# Patient Record
Sex: Female | Born: 1955 | Race: White | Hispanic: Yes | Marital: Single | Smoking: Former smoker
Health system: Southern US, Community
[De-identification: ages and names within clinical notes are randomized; demographics above are authoritative.]

## PROBLEM LIST (undated history)

## (undated) DIAGNOSIS — D649 Anemia, unspecified: Secondary | ICD-10-CM

## (undated) DIAGNOSIS — M1990 Unspecified osteoarthritis, unspecified site: Secondary | ICD-10-CM

## (undated) DIAGNOSIS — A630 Anogenital (venereal) warts: Secondary | ICD-10-CM

## (undated) DIAGNOSIS — IMO0002 Reserved for concepts with insufficient information to code with codable children: Secondary | ICD-10-CM

## (undated) DIAGNOSIS — I10 Essential (primary) hypertension: Secondary | ICD-10-CM

## (undated) DIAGNOSIS — Z860100 Personal history of colon polyps, unspecified: Secondary | ICD-10-CM

## (undated) DIAGNOSIS — N871 Moderate cervical dysplasia: Secondary | ICD-10-CM

## (undated) HISTORY — DX: Anogenital (venereal) warts: A63.0

## (undated) HISTORY — DX: Moderate cervical dysplasia: N87.1

## (undated) HISTORY — DX: Reserved for concepts with insufficient information to code with codable children: IMO0002

## (undated) HISTORY — DX: Essential (primary) hypertension: I10

## (undated) HISTORY — DX: Anemia, unspecified: D64.9

## (undated) HISTORY — DX: Personal history of colonic polyps: Z86.010

## (undated) HISTORY — DX: Personal history of colon polyps, unspecified: Z86.0100

## (undated) HISTORY — PX: OTHER SURGICAL HISTORY: SHX169

## (undated) HISTORY — PX: DILATION AND CURETTAGE OF UTERUS: SHX78

## (undated) HISTORY — PX: LIPOMA EXCISION: SHX5283

---

## 1999-08-02 ENCOUNTER — Other Ambulatory Visit: Admission: RE | Admit: 1999-08-02 | Discharge: 1999-08-02 | Payer: Self-pay | Admitting: Gynecology

## 1999-09-19 ENCOUNTER — Encounter (INDEPENDENT_AMBULATORY_CARE_PROVIDER_SITE_OTHER): Payer: Self-pay

## 1999-09-19 ENCOUNTER — Other Ambulatory Visit: Admission: RE | Admit: 1999-09-19 | Discharge: 1999-09-19 | Payer: Self-pay | Admitting: Gynecology

## 1999-09-23 HISTORY — PX: CERVICAL BIOPSY  W/ LOOP ELECTRODE EXCISION: SUR135

## 1999-10-08 ENCOUNTER — Encounter (INDEPENDENT_AMBULATORY_CARE_PROVIDER_SITE_OTHER): Payer: Self-pay | Admitting: Specialist

## 1999-10-08 ENCOUNTER — Other Ambulatory Visit: Admission: RE | Admit: 1999-10-08 | Discharge: 1999-10-08 | Payer: Self-pay | Admitting: Gynecology

## 2000-02-12 ENCOUNTER — Other Ambulatory Visit: Admission: RE | Admit: 2000-02-12 | Discharge: 2000-02-12 | Payer: Self-pay | Admitting: Gynecology

## 2000-03-13 ENCOUNTER — Other Ambulatory Visit: Admission: RE | Admit: 2000-03-13 | Discharge: 2000-03-13 | Payer: Self-pay | Admitting: Gynecology

## 2000-11-11 ENCOUNTER — Other Ambulatory Visit: Admission: RE | Admit: 2000-11-11 | Discharge: 2000-11-11 | Payer: Self-pay | Admitting: Gynecology

## 2000-12-11 ENCOUNTER — Ambulatory Visit (HOSPITAL_COMMUNITY): Admission: RE | Admit: 2000-12-11 | Discharge: 2000-12-11 | Payer: Self-pay | Admitting: Gastroenterology

## 2002-01-29 ENCOUNTER — Emergency Department (HOSPITAL_COMMUNITY): Admission: EM | Admit: 2002-01-29 | Discharge: 2002-01-29 | Payer: Self-pay

## 2002-06-20 ENCOUNTER — Other Ambulatory Visit: Admission: RE | Admit: 2002-06-20 | Discharge: 2002-06-20 | Payer: Self-pay | Admitting: Gynecology

## 2003-09-27 ENCOUNTER — Other Ambulatory Visit: Admission: RE | Admit: 2003-09-27 | Discharge: 2003-09-27 | Payer: Self-pay | Admitting: Gynecology

## 2004-06-05 ENCOUNTER — Other Ambulatory Visit: Admission: RE | Admit: 2004-06-05 | Discharge: 2004-06-05 | Payer: Self-pay | Admitting: Gynecology

## 2005-02-28 ENCOUNTER — Emergency Department (HOSPITAL_COMMUNITY): Admission: EM | Admit: 2005-02-28 | Discharge: 2005-02-28 | Payer: Self-pay | Admitting: Emergency Medicine

## 2005-03-26 ENCOUNTER — Other Ambulatory Visit: Admission: RE | Admit: 2005-03-26 | Discharge: 2005-03-26 | Payer: Self-pay | Admitting: Gynecology

## 2006-04-14 ENCOUNTER — Other Ambulatory Visit: Admission: RE | Admit: 2006-04-14 | Discharge: 2006-04-14 | Payer: Self-pay | Admitting: Gynecology

## 2007-05-27 ENCOUNTER — Other Ambulatory Visit: Admission: RE | Admit: 2007-05-27 | Discharge: 2007-05-27 | Payer: Self-pay | Admitting: Gynecology

## 2008-05-19 ENCOUNTER — Encounter: Payer: Self-pay | Admitting: Gynecology

## 2008-05-19 ENCOUNTER — Ambulatory Visit (HOSPITAL_BASED_OUTPATIENT_CLINIC_OR_DEPARTMENT_OTHER): Admission: RE | Admit: 2008-05-19 | Discharge: 2008-05-19 | Payer: Self-pay | Admitting: Gynecology

## 2009-03-05 ENCOUNTER — Other Ambulatory Visit: Admission: RE | Admit: 2009-03-05 | Discharge: 2009-03-05 | Payer: Self-pay | Admitting: Gynecology

## 2009-03-05 ENCOUNTER — Ambulatory Visit: Payer: Self-pay | Admitting: Gynecology

## 2009-03-05 ENCOUNTER — Encounter: Payer: Self-pay | Admitting: Gynecology

## 2009-03-20 ENCOUNTER — Ambulatory Visit: Payer: Self-pay | Admitting: Gynecology

## 2009-08-01 ENCOUNTER — Emergency Department (HOSPITAL_BASED_OUTPATIENT_CLINIC_OR_DEPARTMENT_OTHER): Admission: EM | Admit: 2009-08-01 | Discharge: 2009-08-01 | Payer: Self-pay | Admitting: Emergency Medicine

## 2009-08-01 ENCOUNTER — Ambulatory Visit: Payer: Self-pay | Admitting: Diagnostic Radiology

## 2010-02-07 ENCOUNTER — Ambulatory Visit (HOSPITAL_BASED_OUTPATIENT_CLINIC_OR_DEPARTMENT_OTHER): Admission: RE | Admit: 2010-02-07 | Discharge: 2010-02-07 | Payer: Self-pay | Admitting: General Surgery

## 2010-06-13 ENCOUNTER — Emergency Department (HOSPITAL_COMMUNITY): Admission: EM | Admit: 2010-06-13 | Discharge: 2010-06-13 | Payer: Self-pay | Admitting: Emergency Medicine

## 2010-06-20 ENCOUNTER — Other Ambulatory Visit: Admission: RE | Admit: 2010-06-20 | Discharge: 2010-06-20 | Payer: Self-pay | Admitting: Gynecology

## 2010-06-20 ENCOUNTER — Ambulatory Visit: Payer: Self-pay | Admitting: Gynecology

## 2010-06-24 ENCOUNTER — Ambulatory Visit: Payer: Self-pay | Admitting: Gynecology

## 2010-07-01 ENCOUNTER — Ambulatory Visit: Payer: Self-pay | Admitting: Gynecology

## 2010-07-22 ENCOUNTER — Ambulatory Visit: Payer: Self-pay | Admitting: Gynecology

## 2010-08-08 ENCOUNTER — Ambulatory Visit: Payer: Self-pay | Admitting: Diagnostic Radiology

## 2010-08-08 ENCOUNTER — Emergency Department (HOSPITAL_BASED_OUTPATIENT_CLINIC_OR_DEPARTMENT_OTHER): Admission: EM | Admit: 2010-08-08 | Discharge: 2010-08-08 | Payer: Self-pay | Admitting: Emergency Medicine

## 2010-12-05 LAB — CBC
MCH: 29.6 pg (ref 26.0–34.0)
MCHC: 34.1 g/dL (ref 30.0–36.0)
MCV: 86.8 fL (ref 78.0–100.0)
Platelets: 233 10*3/uL (ref 150–400)
RDW: 13.7 % (ref 11.5–15.5)

## 2010-12-05 LAB — POCT CARDIAC MARKERS
Myoglobin, poc: 52 ng/mL (ref 12–200)
Troponin i, poc: <0.05 ng/mL (ref 0.00–0.09)

## 2010-12-05 LAB — DIFFERENTIAL
Basophils Relative: 1 % (ref 0–1)
Eosinophils Absolute: 0.2 10*3/uL (ref 0.0–0.7)
Eosinophils Relative: 2 % (ref 0–5)

## 2010-12-05 LAB — BASIC METABOLIC PANEL
BUN: 9 mg/dL (ref 6–23)
CO2: 25 mEq/L (ref 19–32)
Chloride: 108 mEq/L (ref 96–112)
Creatinine, Ser: 0.66 mg/dL (ref 0.4–1.2)

## 2010-12-05 LAB — TSH: TSH: 2.002 u[IU]/mL (ref 0.350–4.500)

## 2010-12-09 LAB — CBC
MCHC: 34.5 g/dL (ref 30.0–36.0)
MCV: 87.8 fL (ref 78.0–100.0)
Platelets: 196 10*3/uL (ref 150–400)
RBC: 4.01 MIL/uL (ref 3.87–5.11)
WBC: 7 10*3/uL (ref 4.0–10.5)

## 2010-12-09 LAB — DIFFERENTIAL
Basophils Relative: 1 % (ref 0–1)
Eosinophils Absolute: 0.3 10*3/uL (ref 0.0–0.7)
Lymphs Abs: 2.2 10*3/uL (ref 0.7–4.0)
Neutro Abs: 4.1 10*3/uL (ref 1.7–7.7)
Neutrophils Relative %: 58 % (ref 43–77)

## 2010-12-09 LAB — BASIC METABOLIC PANEL
BUN: 10 mg/dL (ref 6–23)
Calcium: 8.5 mg/dL (ref 8.4–10.5)
Chloride: 106 mEq/L (ref 96–112)
Creatinine, Ser: 0.53 mg/dL (ref 0.4–1.2)
GFR calc Af Amer: >60 mL/min (ref >60)

## 2010-12-25 LAB — BASIC METABOLIC PANEL
Calcium: 9.1 mg/dL (ref 8.4–10.5)
GFR calc Af Amer: >60 mL/min (ref >60)
GFR calc non Af Amer: >60 mL/min (ref >60)
Potassium: 3.9 mEq/L (ref 3.5–5.1)
Sodium: 141 mEq/L (ref 135–145)

## 2010-12-25 LAB — POCT CARDIAC MARKERS
CKMB, poc: 1.7 ng/mL (ref 1.0–8.0)
Myoglobin, poc: 64.8 ng/mL (ref 12–200)
Troponin i, poc: <0.05 ng/mL (ref 0.00–0.09)

## 2010-12-25 LAB — CBC
HCT: 38 % (ref 36.0–46.0)
Hemoglobin: 13 g/dL (ref 12.0–15.0)
RBC: 4.34 MIL/uL (ref 3.87–5.11)

## 2010-12-25 LAB — DIFFERENTIAL
Basophils Absolute: 0.1 10*3/uL (ref 0.0–0.1)
Lymphocytes Relative: 29 % (ref 12–46)
Lymphs Abs: 2.6 10*3/uL (ref 0.7–4.0)
Monocytes Absolute: 0.4 10*3/uL (ref 0.1–1.0)
Monocytes Relative: 5 % (ref 3–12)
Neutro Abs: 5.4 10*3/uL (ref 1.7–7.7)

## 2011-02-04 NOTE — Op Note (Signed)
Jamie Montes, Jamie Montes                ACCOUNT NO.:  [id number]   MEDICAL RECORD NO.:  [id number]          PATIENT TYPE:  AMB   LOCATION:  [facility]                         FACILITY:  [facility]   PHYSICIAN:  Juan H. Lily Peer, M.D.DATE OF BIRTH:  1956/06/16   DATE OF PROCEDURE:  DATE OF DISCHARGE:                               OPERATIVE REPORT   HISTORY OF PRESENT ILLNESS:  The patient is a 55 year old gravida 2,  para 2 with dysfunctional uterine bleeding and anemia.  Sonohysterogram  had demonstrated evidence of an endometrial polyp and a thickened  endometrium.  The patient had a workup consisting of endometrial biopsy  which strongly demonstrated secretory endometrium in the office as well  as the ultrasound and there was no evidence of hyperplasia.  The patient  had a small functional cyst, avascular report on the left ovary which  measured 20 mm x 20 mm x 23 mm.   PREOPERATIVE DIAGNOSES:  1. Dysfunctional uterine bleeding.  2. Endometrial polyp.  3. Anemia.   POSTOPERATIVE DIAGNOSES:  1. Dysfunctional uterine bleeding.  2. Endometrial polyp.  3. Anemia.   PROCEDURES PERFORMED:  1. Resectoscopic polypectomy.  2. Dilatation curettage.   SURGEON:  Juan H. Lily Peer, M.D.   ANESTHESIA:  General endotracheal anesthesia.   FINDINGS:  Several small endometrial polyps in the posterior towards the  right uterine sidewall which were resected.  Both tubal ostia were  identified.  No other abnormality was noted and the cervical canal was  smooth but the uterus was very vascular and bleeding on contact.   DESCRIPTION OF OPERATION:  After the patient was adequately counseled,  she was taken to the operating room where she underwent a successful  general endotracheal anesthesia.  She had received cefoxitin 1 g for  prophylaxis and had PSA stockings for DVT prophylaxis.  She was placed  in a high lithotomy position.  The vagina and perineum were prepped and  draped in usual sterile fashion.   A laminaria that previously been  placed the night before was removed, requiring minimal cervical  dilatation.  The Lendell Caprice operative resectoscope with a 90 degree wire  loop was inserted into the intrauterine cavity.  3% sorbitol was the  distending media.  Hysteroscopic inspection demonstrated normal cervical  canal.  The uterus, two small polyps in the posterior to the lower  uterine wall towards the patient's right.  Tubal ostia was identified,  and the uterus is very hypervascular with the Valleylab electrical  surgical generator set at 70 cutting and 70 coag mode.  These polyps  were removed and passed off the operating field for histological  evaluation.  This was followed by a vigorous curettage with a Hunter's  curette.  Because of the vascularities and the bleeding, the VaporTrode  was utilized to contain the bleeding and bleeding in some of the areas  that were very vascular and bleeding to contain and completely resolve  the bleeding.  The patient tolerated the procedure well.  Fluid deficit  from the 3% sorbitol was 275 mL.   IV fluids 79 mL of lactated Ringer's.  Urine output in and out cath was 50 mL.  She had received cefoxitin 1 g  preoperatively and she was given Toradol 30 mg IV and en route to the  recovery room.      Juan H. Lily Peer, M.D.  Electronically Signed     JHF/MEDQ  D:  05/19/2008  T:  05/19/2008  Job:  [id number]

## 2011-02-04 NOTE — H&P (Signed)
Jamie Montes, Jamie Montes                ACCOUNT NO.:  [id number]   MEDICAL RECORD NO.:  [id number]          PATIENT TYPE:  AMB   LOCATION:  [facility]                         FACILITY:  [facility]   PHYSICIAN:  Juan H. Lily Peer, M.D.DATE OF BIRTH:  [birth year]   DATE OF ADMISSION:  05/19/2008  DATE OF DISCHARGE:                              HISTORY & PHYSICAL   The patient is scheduled for surgery tomorrow, Friday, August 28 at  01:00 p.m. at [facility].  Please have history and  physical available.   CHIEF COMPLAINT:  1. Dysfunctional bleeding.  2. Endometrial polyp.   HISTORY OF PRESENT ILLNESS:  The patient is a 55 year old gravida 2,  para 2 who is scheduled to undergo resectoscope polypectomy and D&C  tomorrow, Friday, August 28th, as a result of the patient's  dysfunctional bleeding and workup consist of an endometrial biopsy which  had demonstrated early secretory endometrium with focal stromal  breakdown.  No hyperplasia or malignancy.  She had undergone a  sonohysterogram on July 17th which demonstrated a 23 mm x 50 mm polyp on  the right uterine wall, endometrial thickness 17.6 mm.  She had 2  fibroids, one measuring 38 mm x 32 mm and the other one 22 mm x 16 mm.  Right ovary was normal and left ovary had an echo free thin wall  avascular cyst measuring 20 mm x 28 mm x 23 mm.  The patient's TSH and  prolactin had been normal as well as hCG been negative.   PAST MEDICAL HISTORY:  She has had 2 normal spontaneous vaginal  delivery.  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  She had a uterine suspension several years ago, and she had a DNA for a  first trimester miscarriage.  She is taking calcium with vitamin D and  is currently taking an antidepressive agent which she does not recall  the name.   FAMILY HISTORY:  Mother with history of hypertension.   PHYSICAL EXAMINATION:   GENERAL:  The patient weighs approximately 243  pounds and 5 feet 2-1/2 inch tall.  HEENT:  Unremarkable.  NECK:  Supple.  Trachea midline.  No carotid bruits.  No thyromegaly.  LUNGS:  Clear to auscultation without any rhonchi or wheezes.  HEART:  Regular rate and rhythm.  No murmurs or gallop.  BREAST:  Exam not done.  ABDOMEN:  Soft and nontender.  No rebound or guarding.  PELVIC:  Bartholin, urethra, and Skene are within normal limits.  Vagina  and cervix, no gross lesions on inspection.  Uterus is anteverted.  Normal in size, shape, and consistency.  Adnexa, somewhat difficult to  evaluate due to the fact that the patient weighs 243 pounds.  RECTAL:  Deferred.   ASSESSMENT:  A 55 year old gravida 2, para 2 with dysfunction uterine  bleeding with a workup consistent with sonohysterogram demonstrated an  endometrial polyp and slight endometrial thickness.  Endometrial biopsy  had been benign as was a TSH and prolactin.  She is scheduled to undergo  resectoscope polypectomy and D&C  tomorrow, Friday, August 28 at 01:00 p.m. at [facility].  The risks and benefits and pros and cons of the procedure were discussed  in detail in Spanish.  All questions were answered and will follow  accordingly.   PLAN:  Per assessment above.      Juan H. Lily Peer, M.D.  Electronically Signed     JHF/MEDQ  D:  05/18/2008  T:  05/18/2008  Job:  [id number]

## 2011-02-07 NOTE — Procedures (Signed)
[place]  Patient:    Jamie Montes, Jamie Montes                         MRN: [id number] Proc. Date: 12/11/00 Adm. Date:  [id number] Attending:  Charna Elizabeth CC:         Gaetano Hawthorne. Lily Peer, M.D.   Procedure Report  DATE OF BIRTH:  May 24, 1951.  PROCEDURE:  Colonoscopy.  ENDOSCOPIST:  Anselmo Rod, M.D.  INSTRUMENT USED:  Olympus video colonoscope.  INDICATION FOR PROCEDURE:  Rectal bleeding in a 55 year old Hispanic female. Rule out colonic polyps, masses, hemorrhoids, etc.  PREPROCEDURE PREPARATION:  Informed consent was procured from the patient. The patient was fasted for eight hours prior to the procedure and prepped with a bottle of magnesium citrate and a gallon of NuLytely the night prior to the procedure.  PREPROCEDURE PHYSICAL:  VITAL SIGNS:  The patient had stable vital signs.  NECK:  Supple.  CHEST:  Clear to auscultation.  S1, S2 regular.  ABDOMEN:  Soft with normal abdominal bowel sounds.  DESCRIPTION OF PROCEDURE:  The patient was placed in the left lateral decubitus position and sedated with 70 mg of Demerol and 7 mg of Versed intravenously.  Once the patient was adequately sedate and maintained on low-flow oxygen and continuous cardiac monitoring, the Olympus video colonoscope was advanced from the rectum to the cecum without difficulty. The patient had an excellent prep.  No masses, polyps, erosions, ulcerations, or diverticula were seen.  There was a small external hemorrhoid appreciated on anal inspection and small internal hemorrhoids on retroflexion.  The patient tolerated the procedure well without complications.  IMPRESSION: 1. Small internal hemorrhoids and a small external hemorrhoid. 2. No masses, polyps, erosions, ulcerations, or diverticula seen.  RECOMMENDATIONS: 1. The patient has been advised to increase the fluid and fiber in her diet. 2. Stool softener should be used as needed. 3. Outpatient  follow-up is advised in the next four weeks for further    recommendations. DD:  12/11/00 TD:  12/11/00 Job: [id number]

## 2011-06-27 ENCOUNTER — Encounter: Payer: Self-pay | Admitting: Anesthesiology

## 2011-07-01 ENCOUNTER — Other Ambulatory Visit (HOSPITAL_COMMUNITY)
Admission: RE | Admit: 2011-07-01 | Discharge: 2011-07-01 | Disposition: A | Discharge status: Home / Self Care | Payer: Managed Care, Other (non HMO) | Source: Ambulatory Visit | Attending: Gynecology | Admitting: Gynecology

## 2011-07-01 ENCOUNTER — Encounter: Payer: Self-pay | Admitting: Gynecology

## 2011-07-01 ENCOUNTER — Ambulatory Visit (INDEPENDENT_AMBULATORY_CARE_PROVIDER_SITE_OTHER): Payer: Managed Care, Other (non HMO) | Admitting: Gynecology

## 2011-07-01 VITALS — BP 134/88 | Ht 62.25 in | Wt 248.0 lb

## 2011-07-01 DIAGNOSIS — Z13220 Encounter for screening for lipoid disorders: Secondary | ICD-10-CM

## 2011-07-01 DIAGNOSIS — R635 Abnormal weight gain: Secondary | ICD-10-CM

## 2011-07-01 DIAGNOSIS — E559 Vitamin D deficiency, unspecified: Secondary | ICD-10-CM

## 2011-07-01 DIAGNOSIS — A630 Anogenital (venereal) warts: Secondary | ICD-10-CM

## 2011-07-01 DIAGNOSIS — R823 Hemoglobinuria: Secondary | ICD-10-CM

## 2011-07-01 DIAGNOSIS — Z01419 Encounter for gynecological examination (general) (routine) without abnormal findings: Secondary | ICD-10-CM | POA: Insufficient documentation

## 2011-07-01 LAB — VITAMIN D 25 HYDROXY (VIT D DEFICIENCY, FRACTURES): Vit D, 25-Hydroxy: 37 ng/mL (ref 30–89)

## 2011-07-01 NOTE — Patient Instructions (Signed)
Nos veremos la semana que viene para [employer]

## 2011-07-01 NOTE — Progress Notes (Signed)
Jamie Montes 1956/03/02 [id number]   History:    55 y.o.  for annual exam with complaint of a vulvar growth that she noticed over a few days ago. 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 She is perimenopausal and started to have some oligomenorrhea and some vasomotor symptoms. Review of her record also indicated that she said history vitamin D deficiency in the past by her level had dropped as low as 16. She had been placed on supplemental vitamin D consisting of 50,000 units q. weekly for 12 weeks she did return back to the office for followup vitamin D level which had risen to 23 and she's currently on vitamin D 3 2000 units daily. Review of her record also indicated that she had a colonoscopy this year which demonstrated benign polyps her next OB followup in 5 years. In the past and she had vitamin D deficiency she had a baseline bone density study in 2010 with normal bone mineralization. Patient has been followed by Dr. Gabriel Earing for her hypertension and stated the only blood work she did recently was a blood sugar and was told that was fine. See medication list outlining her medications. She has had a history of CIN-2 and underwent LEEP cervical conization and margins were negative in 2001.  Past medical history,surgical history, family history and social history were all reviewed and documented in the EPIC chart.  ROS:  Was performed and pertinent positives and negatives are included in the history.  Exam: chaperone present BP 134/88  Ht 5' 2.25" (1.581 m)  Wt 248 lb (112.492 kg)  BMI 45.00 kg/m2  LMP 05/25/2011  Body mass index is 45.00 kg/(m^2).  General appearance : Well developed well nourished female. No acute distress HEENT: Neck supple, trachea midline, no carotid bruits, no thyroidmegaly Lungs: Clear to auscultation, no rhonchi or wheezes, or rib retractions  Heart: Regular rate and rhythm, no murmurs or gallops Breast:Examined in sitting and supine  position were symmetrical in appearance, no palpable masses or tenderness,  no skin retraction, no nipple inversion, no nipple discharge, no skin discoloration, no axillary or supraclavicular lymphadenopathy Abdomen: no palpable masses or tenderness, no rebound or guarding Extremities: no edema or skin discoloration or tenderness  Pelvic:  Bartholin, Urethra, Skene Glands: Within normal limits, 2 large condylomas were noted on the inferior aspect of the right labia majora.             Vagina: No gross lesions or discharge  Cervix: No gross lesions or discharge  Uterus  anteverted, normal size, shape and consistency, non-tender and mobile  Adnexa  Without masses or tenderness  Anus and perineum  normal   Rectovaginal  normal sphincter tone without palpated masses or tenderness             Hemoccult not done   The right labia majora condyloma acuminatum was cleansed with Betadine solution and 1% lidocaine was infiltrated at the base and excised completely and sent off for pathological evaluation. Silver nitrate was applied for hemostasis and 1% lidocaine gel was applied topically as well.   Assessment/Plan:  55 y.o. female for annual exam with right labia majora condyloma acuminatum excised specimen submitted for pathological evaluation. Visual return back in one week for followup and discuss results we will also be checking a TSH screening cholesterol CBC urinalysis along with a Pap smear and because her prior history vitamin D deficiency will check her vitamin D level today as well. Her mammogram was done  recently resulting in some this dictation and she was encouraged to continue monthly self breast examination.    Ok Edwards MD, 10:21 AM 07/01/2011

## 2011-07-08 ENCOUNTER — Encounter: Payer: Self-pay | Admitting: Gynecology

## 2011-07-08 ENCOUNTER — Other Ambulatory Visit: Payer: Self-pay | Admitting: *Deleted

## 2011-07-08 ENCOUNTER — Other Ambulatory Visit (HOSPITAL_COMMUNITY)
Admission: RE | Admit: 2011-07-08 | Discharge: 2011-07-08 | Disposition: A | Discharge status: Home / Self Care | Payer: Managed Care, Other (non HMO) | Source: Ambulatory Visit | Attending: Gynecology | Admitting: Gynecology

## 2011-07-08 ENCOUNTER — Ambulatory Visit (INDEPENDENT_AMBULATORY_CARE_PROVIDER_SITE_OTHER): Payer: Managed Care, Other (non HMO) | Admitting: Gynecology

## 2011-07-08 VITALS — BP 128/84

## 2011-07-08 DIAGNOSIS — Z01419 Encounter for gynecological examination (general) (routine) without abnormal findings: Secondary | ICD-10-CM | POA: Insufficient documentation

## 2011-07-08 DIAGNOSIS — R87616 Satisfactory cervical smear but lacking transformation zone: Secondary | ICD-10-CM

## 2011-07-08 DIAGNOSIS — A630 Anogenital (venereal) warts: Secondary | ICD-10-CM

## 2011-07-08 DIAGNOSIS — R823 Hemoglobinuria: Secondary | ICD-10-CM

## 2011-07-08 NOTE — Progress Notes (Signed)
Addended by: Landis Martins R on: 07/08/2011 02:08 PM   Modules accepted: Orders

## 2011-07-08 NOTE — Progress Notes (Signed)
Patient presented to the office today to discuss the results of her last week's right labia majora biopsy. Pathology reported demonstrated condyloma acuminatum. Her Pap smear had been done at the same time but insufficient endocervical cells were noted and she was here for followup for repeat Pap smear as well. We reviewed last week's labs which included a normal CBC, cholesterol, urinalysis, TSH, and vitamin D level.  Pap smear was repeated today with a vigorous ECC obtained as well. Inspection of the right labia majora were excision of the right condyloma appears to be healing very nicely that given her several packs of Neosporin cream to apply once before bedtime. Her mammogram is up-to-date was normal she'll be encouraged to continue monthly self breast examination and she'll continue to followup with her family doctor for hypercholesterolemia. For the gynecological standpoint we'll see her back in one year or when necessary.  Her urine had demonstrated persistence evidence of microscopic hematuria that arrest in urinalysis otherwise unremarkable. We're going to refer her to a urologist for further evaluation of her microscopic hematuria.

## 2011-07-08 NOTE — Progress Notes (Signed)
Pt appointment at [facility] urology on 07/22/11 at 11:15, blanca will call and tell pt this. Recent office note [facility]ed to [facility].

## 2011-09-23 DIAGNOSIS — IMO0002 Reserved for concepts with insufficient information to code with codable children: Secondary | ICD-10-CM

## 2011-09-23 HISTORY — DX: Reserved for concepts with insufficient information to code with codable children: IMO0002

## 2012-03-19 ENCOUNTER — Other Ambulatory Visit: Payer: Self-pay | Admitting: Neurosurgery

## 2012-03-30 ENCOUNTER — Encounter (HOSPITAL_COMMUNITY): Payer: Self-pay | Admitting: Pharmacy Technician

## 2012-04-09 ENCOUNTER — Encounter (HOSPITAL_COMMUNITY): Payer: Self-pay

## 2012-04-09 ENCOUNTER — Encounter (HOSPITAL_COMMUNITY)
Admission: RE | Admit: 2012-04-09 | Discharge: 2012-04-09 | Disposition: A | Discharge status: Home / Self Care | Payer: Managed Care, Other (non HMO) | Source: Ambulatory Visit | Attending: Neurosurgery | Admitting: Neurosurgery

## 2012-04-09 HISTORY — DX: Unspecified osteoarthritis, unspecified site: M19.90

## 2012-04-09 LAB — TYPE AND SCREEN
ABO/RH(D): O POS
Antibody Screen: NEGATIVE

## 2012-04-09 LAB — CBC
HCT: 37.2 % (ref 36.0–46.0)
Hemoglobin: 12.5 g/dL (ref 12.0–15.0)
MCH: 29.3 pg (ref 26.0–34.0)
MCHC: 33.6 g/dL (ref 30.0–36.0)
MCV: 87.1 fL (ref 78.0–100.0)
Platelets: 221 10*3/uL (ref 150–400)
RBC: 4.27 MIL/uL (ref 3.87–5.11)
RDW: 13.7 % (ref 11.5–15.5)
WBC: 8.5 10*3/uL (ref 4.0–10.5)

## 2012-04-09 LAB — BASIC METABOLIC PANEL
BUN: 13 mg/dL (ref 6–23)
CO2: 28 mEq/L (ref 19–32)
Calcium: 9.4 mg/dL (ref 8.4–10.5)
Chloride: 104 mEq/L (ref 96–112)
Creatinine, Ser: 0.7 mg/dL (ref 0.50–1.10)
GFR calc Af Amer: >90 mL/min (ref >90)
GFR calc non Af Amer: >90 mL/min (ref >90)
Glucose, Bld: 97 mg/dL (ref 70–99)
Potassium: 3.3 mEq/L — ABNORMAL LOW (ref 3.5–5.1)
Sodium: 141 mEq/L (ref 135–145)

## 2012-04-09 LAB — SURGICAL PCR SCREEN
MRSA, PCR: NEGATIVE
Staphylococcus aureus: POSITIVE — AB

## 2012-04-09 LAB — ABO/RH: ABO/RH(D): O POS

## 2012-04-09 NOTE — Pre-Procedure Instructions (Addendum)
20 Mihira Tozzi  04/09/2012   Your procedure is scheduled on:  04/12/2012  MONDAY  Report to [facility] at 800          AM.[facility]  Call this number if you have problems the morning of surgery: [phone]   Remember:   Do not eat food:After Midnight.  May have  liquids:until Midnight .    Take these medicines the morning of surgery with A SIP OF WATER: METOPROLOL   NEURONTIN   Do not wear jewelry, make-up or nail polish.  Do not wear lotions, powders, or perfumes. You may wear deodorant.  Do not shave 48 hours prior to surgery. Men may shave face and neck.  Do not bring valuables to the hospital.  Contacts, dentures or bridgework may not be worn into surgery.  Leave suitcase in the car. After surgery it may be brought to your room.  For patients admitted to the hospital, checkout time is 11:00 AM the day of discharge.   Patients discharged the day of surgery will not be allowed to drive home.  Name and phone number of your driver: Ferol Luz dtr [phone]  Special Instructions: CHG Shower Use Special Wash: 1/2 bottle night before surgery and 1/2 bottle morning of surgery.   Please read over the following fact sheets that you were given: Pain Booklet, Coughing and Deep Breathing, Blood Transfusion Information, Lab Information, MRSA Information and Surgical Site Infection Prevention

## 2012-04-11 MED ORDER — CEFAZOLIN SODIUM-DEXTROSE 2-3 GM-% IV SOLR
2.0000 g | INTRAVENOUS | Status: DC
  Filled 2012-04-11: qty 50

## 2012-04-12 ENCOUNTER — Encounter (HOSPITAL_COMMUNITY): Payer: Self-pay | Admitting: Anesthesiology

## 2012-04-12 ENCOUNTER — Encounter (HOSPITAL_COMMUNITY): Payer: Self-pay | Admitting: *Deleted

## 2012-04-12 ENCOUNTER — Ambulatory Visit (HOSPITAL_COMMUNITY): Payer: Managed Care, Other (non HMO)

## 2012-04-12 ENCOUNTER — Ambulatory Visit (HOSPITAL_COMMUNITY): Payer: Managed Care, Other (non HMO) | Admitting: Anesthesiology

## 2012-04-12 ENCOUNTER — Inpatient Hospital Stay (HOSPITAL_COMMUNITY): Payer: Managed Care, Other (non HMO)

## 2012-04-12 ENCOUNTER — Encounter (HOSPITAL_COMMUNITY)
Admission: RE | Disposition: A | Discharge status: Home / Self Care | Payer: Self-pay | Source: Ambulatory Visit | Attending: Neurosurgery

## 2012-04-12 ENCOUNTER — Inpatient Hospital Stay (HOSPITAL_COMMUNITY)
Admission: RE | Admit: 2012-04-12 | Discharge: 2012-04-18 | DRG: 029 | Disposition: A | Discharge status: Home / Self Care | Payer: Managed Care, Other (non HMO) | Source: Ambulatory Visit | Attending: Neurosurgery | Admitting: Neurosurgery

## 2012-04-12 DIAGNOSIS — Z6841 Body Mass Index (BMI) 40.0 and over, adult: Secondary | ICD-10-CM

## 2012-04-12 DIAGNOSIS — I10 Essential (primary) hypertension: Secondary | ICD-10-CM | POA: Diagnosis present

## 2012-04-12 DIAGNOSIS — M129 Arthropathy, unspecified: Secondary | ICD-10-CM | POA: Diagnosis present

## 2012-04-12 DIAGNOSIS — Z01812 Encounter for preprocedural laboratory examination: Secondary | ICD-10-CM

## 2012-04-12 DIAGNOSIS — G935 Compression of brain: Principal | ICD-10-CM | POA: Diagnosis present

## 2012-04-12 HISTORY — PX: SUBOCCIPITAL CRANIECTOMY CERVICAL LAMINECTOMY: SHX5404

## 2012-04-12 SURGERY — SUBOCCIPITAL CRANIECTOMY CERVICAL LAMINECTOMY/DURAPLASTY
Anesthesia: General | Site: Neck | Wound class: Clean

## 2012-04-12 MED ORDER — SODIUM CHLORIDE 0.9 % IV SOLN
INTRAVENOUS | Status: DC | PRN
  Administered 2012-04-12 (×4): via INTRAVENOUS

## 2012-04-12 MED ORDER — PANTOPRAZOLE SODIUM 40 MG IV SOLR
40.0000 mg | Freq: Every day | INTRAVENOUS | Status: DC
  Administered 2012-04-12 – 2012-04-13 (×2): 40 mg via INTRAVENOUS
  Filled 2012-04-12 (×3): qty 40

## 2012-04-12 MED ORDER — ROCURONIUM BROMIDE 100 MG/10ML IV SOLN
INTRAVENOUS | Status: DC | PRN
  Administered 2012-04-12: 10 mg via INTRAVENOUS
  Administered 2012-04-12 (×2): 20 mg via INTRAVENOUS
  Administered 2012-04-12: 50 mg via INTRAVENOUS
  Administered 2012-04-12 (×2): 10 mg via INTRAVENOUS

## 2012-04-12 MED ORDER — FENTANYL CITRATE 0.05 MG/ML IJ SOLN
INTRAMUSCULAR | Status: DC | PRN
  Administered 2012-04-12: 50 ug via INTRAVENOUS
  Administered 2012-04-12: 150 ug via INTRAVENOUS
  Administered 2012-04-12 (×3): 50 ug via INTRAVENOUS

## 2012-04-12 MED ORDER — METOPROLOL SUCCINATE ER 100 MG PO TB24
100.0000 mg | ORAL_TABLET | Freq: Every day | ORAL | Status: DC
  Administered 2012-04-13 – 2012-04-18 (×6): 100 mg via ORAL
  Filled 2012-04-12 (×7): qty 1

## 2012-04-12 MED ORDER — BISACODYL 10 MG RE SUPP
10.0000 mg | Freq: Every day | RECTAL | Status: DC | PRN
  Filled 2012-04-12 (×2): qty 1

## 2012-04-12 MED ORDER — METHYLENE BLUE 1 % INJ SOLN
INTRAMUSCULAR | Status: DC | PRN
  Administered 2012-04-12: 5 mL

## 2012-04-12 MED ORDER — ACETAMINOPHEN 650 MG RE SUPP: 650.0000 mg | RECTAL | Status: DC | PRN

## 2012-04-12 MED ORDER — POTASSIUM CHLORIDE IN NACL 40-0.9 MEQ/L-% IV SOLN
INTRAVENOUS | Status: DC
  Administered 2012-04-12: 19:00:00 via INTRAVENOUS
  Administered 2012-04-13 (×2): 125 mL/h via INTRAVENOUS
  Administered 2012-04-14: 13:00:00 via INTRAVENOUS
  Filled 2012-04-12 (×8): qty 1000

## 2012-04-12 MED ORDER — DEXAMETHASONE SODIUM PHOSPHATE 10 MG/ML IJ SOLN
6.0000 mg | Freq: Four times a day (QID) | INTRAMUSCULAR | Status: AC
  Administered 2012-04-12 – 2012-04-13 (×4): 6 mg via INTRAVENOUS
  Filled 2012-04-12: qty 0.6
  Filled 2012-04-12: qty 1
  Filled 2012-04-12 (×3): qty 0.6

## 2012-04-12 MED ORDER — ACETAMINOPHEN 325 MG PO TABS: 650.0000 mg | ORAL_TABLET | ORAL | Status: DC | PRN

## 2012-04-12 MED ORDER — MORPHINE SULFATE 2 MG/ML IJ SOLN
INTRAMUSCULAR | Status: AC
  Filled 2012-04-12: qty 1

## 2012-04-12 MED ORDER — PHENYLEPHRINE HCL 10 MG/ML IJ SOLN
10.0000 mg | INTRAVENOUS | Status: DC | PRN
  Administered 2012-04-12 (×2): 10 ug/min via INTRAVENOUS

## 2012-04-12 MED ORDER — HYDROMORPHONE HCL PF 1 MG/ML IJ SOLN: 0.2500 mg | INTRAMUSCULAR | Status: DC | PRN

## 2012-04-12 MED ORDER — DEXAMETHASONE SODIUM PHOSPHATE 4 MG/ML IJ SOLN
4.0000 mg | Freq: Three times a day (TID) | INTRAMUSCULAR | Status: DC
  Administered 2012-04-14: 4 mg via INTRAVENOUS
  Filled 2012-04-12 (×2): qty 1

## 2012-04-12 MED ORDER — FAMOTIDINE IN NACL 20-0.9 MG/50ML-% IV SOLN
20.0000 mg | INTRAVENOUS | Status: AC
  Administered 2012-04-12: 20 mg via INTRAVENOUS
  Filled 2012-04-12: qty 50

## 2012-04-12 MED ORDER — LIDOCAINE-EPINEPHRINE 1 %-1:100000 IJ SOLN
INTRAMUSCULAR | Status: DC | PRN
  Administered 2012-04-12: 20 mL

## 2012-04-12 MED ORDER — THROMBIN 20000 UNITS EX KIT
PACK | CUTANEOUS | Status: DC | PRN
  Administered 2012-04-12: 20000 [IU] via TOPICAL

## 2012-04-12 MED ORDER — ONDANSETRON HCL 4 MG PO TABS: 4.0000 mg | ORAL_TABLET | ORAL | Status: DC | PRN

## 2012-04-12 MED ORDER — PROPOFOL 10 MG/ML IV EMUL
INTRAVENOUS | Status: DC | PRN
  Administered 2012-04-12: 50 mg via INTRAVENOUS
  Administered 2012-04-12: 200 mg via INTRAVENOUS

## 2012-04-12 MED ORDER — HEMOSTATIC AGENTS (NO CHARGE) OPTIME
TOPICAL | Status: DC | PRN
  Administered 2012-04-12: 1 via TOPICAL

## 2012-04-12 MED ORDER — DEXAMETHASONE SODIUM PHOSPHATE 4 MG/ML IJ SOLN
INTRAMUSCULAR | Status: DC | PRN
  Administered 2012-04-12: 10 mg via INTRAVENOUS

## 2012-04-12 MED ORDER — NEOSTIGMINE METHYLSULFATE 1 MG/ML IJ SOLN
INTRAMUSCULAR | Status: DC | PRN
  Administered 2012-04-12: 4 mg via INTRAVENOUS

## 2012-04-12 MED ORDER — MIDAZOLAM HCL 5 MG/5ML IJ SOLN
INTRAMUSCULAR | Status: DC | PRN
  Administered 2012-04-12: 2 mg via INTRAVENOUS

## 2012-04-12 MED ORDER — 0.9 % SODIUM CHLORIDE (POUR BTL) OPTIME
TOPICAL | Status: DC | PRN
  Administered 2012-04-12 (×3): 1000 mL

## 2012-04-12 MED ORDER — HYDROXYZINE HCL 50 MG/ML IM SOLN
50.0000 mg | INTRAMUSCULAR | Status: DC | PRN
  Filled 2012-04-12: qty 1

## 2012-04-12 MED ORDER — MAGNESIUM HYDROXIDE 400 MG/5ML PO SUSP: 30.0000 mL | Freq: Every day | ORAL | Status: DC | PRN

## 2012-04-12 MED ORDER — ONDANSETRON HCL 4 MG/2ML IJ SOLN
INTRAMUSCULAR | Status: DC | PRN
  Administered 2012-04-12: 4 mg via INTRAVENOUS

## 2012-04-12 MED ORDER — MORPHINE SULFATE 2 MG/ML IJ SOLN
1.0000 mg | INTRAMUSCULAR | Status: DC | PRN
  Administered 2012-04-12 – 2012-04-14 (×13): 2 mg via INTRAVENOUS
  Filled 2012-04-12 (×6): qty 1
  Filled 2012-04-12: qty 2
  Filled 2012-04-12 (×5): qty 1

## 2012-04-12 MED ORDER — LABETALOL HCL 5 MG/ML IV SOLN: 5.0000 mg | INTRAVENOUS | Status: DC | PRN

## 2012-04-12 MED ORDER — BUPIVACAINE-EPINEPHRINE 0.5% -1:200000 IJ SOLN
INTRAMUSCULAR | Status: DC | PRN
  Administered 2012-04-12: 50 mL

## 2012-04-12 MED ORDER — GLYCOPYRROLATE 0.2 MG/ML IJ SOLN
INTRAMUSCULAR | Status: DC | PRN
  Administered 2012-04-12: .8 mg via INTRAVENOUS

## 2012-04-12 MED ORDER — DEXAMETHASONE SODIUM PHOSPHATE 4 MG/ML IJ SOLN
4.0000 mg | Freq: Four times a day (QID) | INTRAMUSCULAR | Status: AC
  Administered 2012-04-13 – 2012-04-14 (×4): 4 mg via INTRAVENOUS
  Filled 2012-04-12 (×4): qty 1

## 2012-04-12 MED ORDER — ONDANSETRON HCL 4 MG/2ML IJ SOLN
4.0000 mg | INTRAMUSCULAR | Status: DC | PRN
  Administered 2012-04-12: 4 mg via INTRAVENOUS
  Filled 2012-04-12: qty 2

## 2012-04-12 MED ORDER — ONDANSETRON HCL 4 MG/2ML IJ SOLN: 4.0000 mg | Freq: Four times a day (QID) | INTRAMUSCULAR | Status: DC | PRN

## 2012-04-12 MED ORDER — HYDROXYZINE HCL 25 MG PO TABS
50.0000 mg | ORAL_TABLET | ORAL | Status: DC | PRN
  Filled 2012-04-12: qty 1

## 2012-04-12 MED ORDER — DEXTROSE 5 % IV SOLN
2.0000 g | INTRAVENOUS | Status: AC
  Administered 2012-04-12: 2 g via INTRAVENOUS
  Filled 2012-04-12: qty 2

## 2012-04-12 SURGICAL SUPPLY — 67 items
BENZOIN TINCTURE PRP APPL 2/3 (GAUZE/BANDAGES/DRESSINGS) IMPLANT
BLADE SURG ROTATE 9660 (MISCELLANEOUS) ×4 IMPLANT
BLADE ULTRA TIP 2M (BLADE) ×2 IMPLANT
BRUSH SCRUB EZ 1% IODOPHOR (MISCELLANEOUS) ×2 IMPLANT
BUR ACORN 6.0 PRECISION (BURR) ×2 IMPLANT
BUR ACRON 5.0MM COATED (BURR) ×2 IMPLANT
BUR MATCHSTICK NEURO 3.0 LAGG (BURR) ×2 IMPLANT
CANISTER SUCTION 2500CC (MISCELLANEOUS) ×2 IMPLANT
CLIP TI MEDIUM 6 (CLIP) IMPLANT
CLOTH BEACON ORANGE TIMEOUT ST (SAFETY) ×2 IMPLANT
CONT SPEC 4OZ CLIKSEAL STRL BL (MISCELLANEOUS) ×2 IMPLANT
CORDS BIPOLAR (ELECTRODE) ×2 IMPLANT
COVER MAYO STAND STRL (DRAPES) ×2 IMPLANT
COVER TABLE BACK 60X90 (DRAPES) IMPLANT
DERMABOND ADVANCED (GAUZE/BANDAGES/DRESSINGS) ×2
DERMABOND ADVANCED .7 DNX12 (GAUZE/BANDAGES/DRESSINGS) ×2 IMPLANT
DRAPE LAPAROTOMY 100X72 PEDS (DRAPES) ×2 IMPLANT
DRAPE MICROSCOPE LEICA (MISCELLANEOUS) ×2 IMPLANT
DRAPE WARM FLUID 44X44 (DRAPE) ×2 IMPLANT
DRSG ADAPTIC 3X8 NADH LF (GAUZE/BANDAGES/DRESSINGS) ×2 IMPLANT
DRSG EMULSION OIL 3X3 NADH (GAUZE/BANDAGES/DRESSINGS) IMPLANT
DURAGUARD 04CMX04CM ×2 IMPLANT
ELECT CAUTERY BLADE 6.4 (BLADE) ×2 IMPLANT
ELECT REM PT RETURN 9FT ADLT (ELECTROSURGICAL) ×2
ELECTRODE REM PT RTRN 9FT ADLT (ELECTROSURGICAL) ×1 IMPLANT
GAUZE SPONGE 4X4 16PLY XRAY LF (GAUZE/BANDAGES/DRESSINGS) IMPLANT
GLOVE BIO SURGEON STRL SZ8.5 (GLOVE) ×2 IMPLANT
GLOVE BIOGEL PI IND STRL 8 (GLOVE) ×1 IMPLANT
GLOVE BIOGEL PI INDICATOR 8 (GLOVE) ×1
GLOVE ECLIPSE 7.5 STRL STRAW (GLOVE) ×2 IMPLANT
GLOVE ECLIPSE 8.5 STRL (GLOVE) ×2 IMPLANT
GLOVE EXAM NITRILE LRG STRL (GLOVE) IMPLANT
GLOVE EXAM NITRILE MD LF STRL (GLOVE) ×2 IMPLANT
GLOVE EXAM NITRILE XL STR (GLOVE) IMPLANT
GLOVE EXAM NITRILE XS STR PU (GLOVE) IMPLANT
GLOVE INDICATOR 7.0 STRL GRN (GLOVE) ×2 IMPLANT
GLOVE SKINSENSE NS SZ6.5 (GLOVE) ×3
GLOVE SKINSENSE STRL SZ6.5 (GLOVE) ×3 IMPLANT
GOWN BRE IMP SLV AUR LG STRL (GOWN DISPOSABLE) IMPLANT
GOWN BRE IMP SLV AUR XL STRL (GOWN DISPOSABLE) ×4 IMPLANT
GOWN STRL REIN 2XL LVL4 (GOWN DISPOSABLE) IMPLANT
KIT BASIN OR (CUSTOM PROCEDURE TRAY) ×2 IMPLANT
KIT ROOM TURNOVER OR (KITS) ×2 IMPLANT
NS IRRIG 1000ML POUR BTL (IV SOLUTION) ×2 IMPLANT
PACK CRANIOTOMY (CUSTOM PROCEDURE TRAY) ×2 IMPLANT
PAD ARMBOARD 7.5X6 YLW CONV (MISCELLANEOUS) ×6 IMPLANT
PATTIES SURGICAL 1/4 X 3 (GAUZE/BANDAGES/DRESSINGS) IMPLANT
RUBBERBAND STERILE (MISCELLANEOUS) IMPLANT
SPONGE GAUZE 4X4 12PLY (GAUZE/BANDAGES/DRESSINGS) ×2 IMPLANT
SPONGE LAP 4X18 X RAY DECT (DISPOSABLE) IMPLANT
STAPLER SKIN PROX WIDE 3.9 (STAPLE) IMPLANT
STRIP CLOSURE SKIN 1/4X4 (GAUZE/BANDAGES/DRESSINGS) IMPLANT
SUT ETHILON 3 0 FSL (SUTURE) IMPLANT
SUT NURALON 4 0 TR CR/8 (SUTURE) ×4 IMPLANT
SUT PROLENE 6 0 BV (SUTURE) IMPLANT
SUT VIC AB 0 CT1 18XCR BRD8 (SUTURE) ×2 IMPLANT
SUT VIC AB 0 CT1 8-18 (SUTURE) ×2
SUT VIC AB 2-0 CP2 18 (SUTURE) ×4 IMPLANT
SUT VIC AB 3-0 SH 8-18 (SUTURE) IMPLANT
SYR 20ML ECCENTRIC (SYRINGE) ×2 IMPLANT
SYR CONTROL 10ML LL (SYRINGE) ×2 IMPLANT
TAPE CLOTH SURG 4X10 WHT LF (GAUZE/BANDAGES/DRESSINGS) ×2 IMPLANT
TOWEL OR 17X24 6PK STRL BLUE (TOWEL DISPOSABLE) IMPLANT
TOWEL OR 17X26 10 PK STRL BLUE (TOWEL DISPOSABLE) ×2 IMPLANT
TRAY FOLEY CATH 14FRSI W/METER (CATHETERS) IMPLANT
UNDERPAD 30X30 INCONTINENT (UNDERPADS AND DIAPERS) IMPLANT
WATER STERILE IRR 1000ML POUR (IV SOLUTION) ×2 IMPLANT

## 2012-04-12 NOTE — Progress Notes (Signed)
Subjective: Patient seen with her grandson who functioned as a Nurse, learning disability along with the nurse who also assisted with translating. Patient resting comfortably in bed, incisional discomfort but no significant headache. Dressing clean and dry. Denies nausea, has had no vomiting.  Objective: Vital signs in last 24 hours: Filed Vitals:   04/12/12 1630 04/12/12 1645 04/12/12 1700 04/12/12 1800  BP: 165/87 151/77 157/78 131/61  Pulse: 80 73 80 81  Temp:   98.5 F (36.9 C)   TempSrc:   Oral   Resp: 11 13 15 11   SpO2: 97% 96% 96% 96%    Intake/Output from previous day:   Intake/Output this shift: Total I/O In: 3250 [I.V.:3250] Out: 1075 [Urine:875; Blood:200]  Physical Exam:  Awake and alert, oriented. Following commands. Moving all 4 extremities well. Extra ocular movements intact. Facial movements symmetrical.   Studies/Results: Dg Cervical Spine 1 View  04/12/2012  *RADIOLOGY REPORT*  Clinical Data: Instrumentation for craniotomy/laminectomy.  DG CERVICAL SPINE - 1 VIEW  Comparison: 02/08/2007 MR.  Findings: Single intraoperative lateral view of the cervical spine submitted for review after surgery.  This reveals superior metallic rakes posterior to the lower aspect of the occipital bone. 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  IMPRESSION: Single intraoperative lateral view of the cervical spine submitted for review after surgery.  This reveals superior metallic rakes posterior to the lower aspect of the occipital bone. 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  Original Report Authenticated By: Fuller Canada, M.D.   Dg Chest Port 1 View  04/12/2012  *RADIOLOGY REPORT*  Clinical Data: Central line placement.  PORTABLE CHEST - 1 VIEW  Comparison: 04/09/2012.  Findings: Right central line tip mid superior vena cava level.  No gross pneumothorax.  Cardiomegaly.   Pulmonary vascular congestion.  Mildly tortuous aorta.  IMPRESSION: Right central line tip mid superior vena cava level.  No gross pneumothorax.  Cardiomegaly.  Pulmonary vascular congestion.  Mildly tortuous aorta  Original Report Authenticated By: Fuller Canada, M.D.    Assessment/Plan: Stable following Chiari decompression including suboccipital craniectomy, upper cervical laminectomy, and duraplasty. To have labs in a.m.   Hewitt Shorts, MD 04/12/2012, 6:54 PM

## 2012-04-12 NOTE — Op Note (Signed)
04/12/2012  3:48 PM  PATIENT:  Jamie Montes  56 y.o. female  PRE-OPERATIVE DIAGNOSIS:  chiari malformation  POST-OPERATIVE DIAGNOSIS:  chiari malformation  PROCEDURE:  Procedure(s): SUBOCCIPITAL CRANIECTOMY CERVICAL LAMINECTOMY/DURAPLASTY with microdissection and microsurgical technique  SURGEON:  Surgeon(s): Hewitt Shorts, MD Barnett Abu, MD  ASSISTANTS: Barnett Abu, M.D.  ANESTHESIA:   general  EBL:  Total I/O In: 3250 [I.V.:3250] Out: 925 [Urine:725; Blood:200]  BLOOD ADMINISTERED:none  COUNT: Correct per nursing staff  DICTATION: Patient was brought to the operating room, placed under general endotracheal anesthesia. 3 pin Mayfield head holder was applied, the patient was turned to a prone position. The occipital scalp was shaved and then the occiput, posterior neck, and upper back were prepped with Betadine soap and solution and draped in a sterile fashion. The midline was infiltrated with local anesthetic with epinephrine, and then a midline incision is made carried down to the subcutaneous tissue bipolar cautery and electrocautery used to maintain hemostasis dissection was carried down to the occiput and upper posterior cervical elements. 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 Once adequate exposure was achieved laterally, we proceeded with the bony decompression. A suboccipital craniectomy and upper cervical laminectomy (of what represented the posterior elements of C1 and C2) was performed. Then with microdissection and microsurgical technique we proceeded with the opening the dura. It was initially opened caudally, and then the dural opening was extended rostrally. Once we reached the inferior aspect of the posterior fossa dura, the dural opening was  then split in a Y-shaped fashion extending superolaterally on each side. We then used a 4 x 4 cm piece of Dura-Guard which was cut to the size of the dural opening (in essentially a triangular-shaped). It was sutured to the dura with interrupted and running 4-0 Nurolon sutures. Good dural closure was achieved, and we Valsalva the patient to 40 cm water twice, without any evidence of CSF leakage. The edges of the bone were waxed, and Gelfoam with thrombin was placed along the edges of the epidural space and good hemostasis was achieved. We then proceeded with closure. The paracervical musculature was approximate interrupted undyed 0 Vicryl sutures, the deep fascia was closed with interrupted undyed 0 Vicryl sutures, Scarpa's fascia closed with interrupted undyed inverted 0 Vicryl sutures. The subcutaneous and subcuticular layer were closed with interrupted inverted 2-0 Vicryl sutures. Skin is approximate surgical staples. We'll was dressed with Adaptic, sterile gauze, and Hypafix. Following surgery the patient was turned back in supine position, the 3 pin Mayfield head holder was removed, and the patient is to be reversed and the anesthetic, extubated, and transferred to the recovery room for further care.  PLAN OF CARE: Admit to inpatient   PATIENT DISPOSITION:  PACU - hemodynamically stable.   Delay start of Pharmacological VTE agent (>24hrs) due to surgical blood loss or risk of bleeding:  yes

## 2012-04-12 NOTE — Progress Notes (Signed)
Chest xray report called to dr. Jean Rosenthal

## 2012-04-12 NOTE — Anesthesia Postprocedure Evaluation (Signed)
  Anesthesia Post-op Note  Patient: Jamie Montes  Procedure(s) Performed: Procedure(s) (LRB): SUBOCCIPITAL CRANIECTOMY CERVICAL LAMINECTOMY/DURAPLASTY (N/A)  Patient Location: PACU  Anesthesia Type: General  Level of Consciousness: awake, alert  and oriented  Airway and Oxygen Therapy: Patient Spontanous Breathing and Patient connected to nasal cannula oxygen  Post-op Pain: mild  Post-op Assessment: Post-op Vital signs reviewed, Patient's Cardiovascular Status Stable, Respiratory Function Stable, Patent Airway, No signs of Nausea or vomiting and Pain level controlled  Post-op Vital Signs: Reviewed and stable  Complications: No apparent anesthesia complications

## 2012-04-12 NOTE — Transfer of Care (Signed)
Immediate Anesthesia Transfer of Care Note  Patient: Jamie Montes  Procedure(s) Performed: Procedure(s) (LRB): SUBOCCIPITAL CRANIECTOMY CERVICAL LAMINECTOMY/DURAPLASTY (N/A)  Patient Location: PACU  Anesthesia Type: General  Level of Consciousness: awake, alert  and oriented  Airway & Oxygen Therapy: Patient Spontanous Breathing and Patient connected to nasal cannula oxygen  Post-op Assessment: Report given to PACU RN, Post -op Vital signs reviewed and stable and Patient moving all extremities X 4  Post vital signs: Reviewed and stable  Complications: No apparent anesthesia complications

## 2012-04-12 NOTE — Progress Notes (Signed)
Difficulty with arterial line , cable changed

## 2012-04-12 NOTE — H&P (Signed)
Jamie Montes   DOB:  07-10-1956     HISTORY OF PRESENT ILLNESS:  The patient is a 56 year old right-handed white female who has emigrated from [place] and who lives here with her family.  The patient herself does not speak English but she was accompanied by her grandson, Inge Rise, who is a 61 year old rising senior at [employer] and who functioned as a Nurse, learning disability. The patient is seen at the request of Dr. Gabriel Earing from [facility] for evaluation of Chiari malformation.    The patient explains that her symptoms began about three years ago first with some left facial pain over the left eye and left ear.  She saw Dr. Andi Devon and was prescribed Gabapentin which helped the symptoms.  However, she has been having increased symptoms since April of this year with a variety of symptoms including dizziness, vomiting, and sweating.  She initially saw Dr. Andi Devon and was prescribed anti-emetics.  She then developed headache which she describes as a heaviness on the top of her head at night.  She has had some ringing in her ears.  She occasionally has some numbness in her cheeks and she has been having increasing difficulties with imbalance.   The Gabapentin has helped some of the symptoms but others including pain and discomfort have persisted.    She finds that bending down she often has a feeling of falling forward as well as worsening of the pressure on top of her head.  She apparently had a number of emergency room visits as well as visits to [facility], and eventually Dr. Andi Devon obtained an MRI of the brain at [facility] on 03/10/2012 and this revealed a Chiari malformation, and neurosurgical consultation was requested.     PAST MEDICAL HISTORY:  She has a history of hypertension which is treated with Metoprolol 100 mg. q.d.  She does not describe a history of myocardial infarction, cancer, stroke, diabetes, peptic ulcer disease or lung disease.  Previous surgery includes GYN  surgery in [place] 25 years or more ago.    She denies allergies to medications.  Current medications include Metoprolol 100 mg. q.d. and Gabapentin 100 mg. b.i.d.    FAMILY HISTORY:    Mother died of a stroke.  Father is alive and living in [place].   SOCIAL HISTORY:    The patient is divorced.  She works as a Advertising copywriter at [employer].  She lives with her family here in [place].  She does not smoke, drink alcoholic beverages or have a history of substance abuse.  REVIEW OF SYSTEMS:   Notable for those difficulties described in the History of Present Illness and Past Medical History but a 14-point Review of Systems sheet is otherwise unremarkable.    PHYSICAL EXAMINATION:  The patient is a well developed, well nourished somewhat obese female in no acute distress. Ht. 5'5". Wt. 248 pounds.  She is afebrile.  Lungs are clear to auscultation.  She has symmetrical respiratory excursion.  Heart has a regular rate and rhythm.  Normal S1 and S2.  No murmur.  Extremity examination shows no clubbing, cyanosis or edema.    NEUROLOGICAL EXAMINATION: Mental status examination shows the patient is awake, alert and she is oriented.  Cranial nerves show PERRL, EOM's intact, facial movement is symmetrical, hearing is present bilaterally, palatal movement is symmetrical, shoulder shrug is symmetrical and tongue is midline.   Motor examination shows 5/5 strength in the upper and lower extremities.  She has no drift  to the upper extremities.  Sensation is intact to pin prick bilaterally.  Reflexes are trace to 1 in the upper and lower extremities, they are symmetrical.  Toes are downgoing bilaterally.  She has a normal gait and stance.    DIAGNOSTIC STUDIES:   MRI of the brain done 9 days ago at [facility] was reviewed by CD-ROM. The study confirms evidence of Chiari malformation without evidence of syringobulbia or syringomyelia.  However, there does appear to be significant compression by the impacted  cerebellar tonsils on the lower brain stem and upper cervical spinal cord.    IMPRESSION:    Increasingly symptomatic Chiari malformation without evidence of hydrocephalus, syringomyelia or syringobulbia.    RECOMMENDATIONS:   I discussed my assessment and impression with the patient and her grandson.  We reviewed her MRI together and I have recommended surgical decompression, specifically a suboccipital craniectomy, upper cervical laminectomy, and duraplasty with dural substitute.  I discussed the nature of her condition and the nature of her surgical procedure using her MRI scan and a model in the office today.  We discussed the typical length of surgery, hospital stay, ICU stay and overall recuperation and her limitations during her postoperative period but also expectations for gradually increasing activities through the postoperative period.  I expect that she will need to be out of work for at least two months.    We discussed risks of surgery including risk of infection, bleeding, and possible need for transfusion, need of neurologic dysfunction involving the brain stem or spinal cord including difficulties including hearing, swallowing, and movement of her extremities, coma and death.  We also discussed the risks of CSF leakage and possible need for further surgery, and the anesthetic risks of myocardial infarction, stroke, pneumonia and death.    On the other hand, I explained to the patient that left untreated it is likely she is going to become increasingly symptomatic.    Her questions and her family's questions were answered for them.  She does want to proceed with surgery.    [facility]         Hewitt Shorts, M.D.

## 2012-04-12 NOTE — Anesthesia Preprocedure Evaluation (Addendum)
Anesthesia Evaluation  Patient identified by MRN, date of birth, ID band Patient awake    Reviewed: Allergy & Precautions, H&P , NPO status , Patient's Chart, lab work & pertinent test results, reviewed documented beta blocker date and time   Airway Mallampati: II  Neck ROM: full    Dental  (+) Dental Advisory Given and Teeth Intact   Pulmonary  breath sounds clear to auscultation        Cardiovascular hypertension, Pt. on medications DVT Rhythm:Regular Rate:Normal     Neuro/Psych    GI/Hepatic   Endo/Other  Morbid obesity  Renal/GU      Musculoskeletal  (+) Arthritis -,   Abdominal (+)  Abdomen: soft. Bowel sounds: normal.  Peds  Hematology   Anesthesia Other Findings   Reproductive/Obstetrics                         Anesthesia Physical Anesthesia Plan  ASA: II  Anesthesia Plan: General   Post-op Pain Management:    Induction: Intravenous  Airway Management Planned: Oral ETT  Additional Equipment: Arterial line  Intra-op Plan:   Post-operative Plan: Extubation in OR  Informed Consent: I have reviewed the patients History and Physical, chart, labs and discussed the procedure including the risks, benefits and alternatives for the proposed anesthesia with the patient or authorized representative who has indicated his/her understanding and acceptance.     Plan Discussed with: CRNA and Surgeon  Anesthesia Plan Comments:         Anesthesia Quick Evaluation

## 2012-04-12 NOTE — Anesthesia Procedure Notes (Signed)
Procedure Name: Intubation Date/Time: 04/12/2012 11:56 AM Performed by: Ellin Goodie Pre-anesthesia Checklist: Patient identified, Emergency Drugs available, Suction available, Patient being monitored and Timeout performed Patient Re-evaluated:Patient Re-evaluated prior to inductionOxygen Delivery Method: Circle system utilized Preoxygenation: Pre-oxygenation with 100% oxygen Intubation Type: IV induction Ventilation: Mask ventilation without difficulty Laryngoscope Size: Mac and 3 Grade View: Grade I Tube size: 7.5 mm Number of attempts: 1 Airway Equipment and Method: Stylet Placement Confirmation: ETT inserted through vocal cords under direct vision,  positive ETCO2 and breath sounds checked- equal and bilateral Secured at: 23 cm Tube secured with: Tape Dental Injury: Teeth and Oropharynx as per pre-operative assessment

## 2012-04-13 ENCOUNTER — Encounter (HOSPITAL_COMMUNITY): Payer: Self-pay | Admitting: Neurosurgery

## 2012-04-13 LAB — BASIC METABOLIC PANEL
BUN: 9 mg/dL (ref 6–23)
CO2: 22 mEq/L (ref 19–32)
Calcium: 8.8 mg/dL (ref 8.4–10.5)
Chloride: 103 mEq/L (ref 96–112)
Creatinine, Ser: 0.55 mg/dL (ref 0.50–1.10)
GFR calc Af Amer: >90 mL/min (ref >90)
GFR calc non Af Amer: >90 mL/min (ref >90)
Glucose, Bld: 143 mg/dL — ABNORMAL HIGH (ref 70–99)
Potassium: 3.7 mEq/L (ref 3.5–5.1)
Sodium: 137 mEq/L (ref 135–145)

## 2012-04-13 LAB — CBC
HCT: 36.2 % (ref 36.0–46.0)
Hemoglobin: 12.4 g/dL (ref 12.0–15.0)
MCH: 29.5 pg (ref 26.0–34.0)
MCHC: 34.3 g/dL (ref 30.0–36.0)
MCV: 86 fL (ref 78.0–100.0)
Platelets: 196 10*3/uL (ref 150–400)
RBC: 4.21 MIL/uL (ref 3.87–5.11)
RDW: 13.5 % (ref 11.5–15.5)
WBC: 18.1 10*3/uL — ABNORMAL HIGH (ref 4.0–10.5)

## 2012-04-13 LAB — DIFFERENTIAL
Basophils Absolute: 0 10*3/uL (ref 0.0–0.1)
Basophils Relative: 0 % (ref 0–1)
Eosinophils Absolute: 0 10*3/uL (ref 0.0–0.7)
Eosinophils Relative: 0 % (ref 0–5)
Lymphocytes Relative: 8 % — ABNORMAL LOW (ref 12–46)
Lymphs Abs: 1.5 10*3/uL (ref 0.7–4.0)
Monocytes Absolute: 0.4 10*3/uL (ref 0.1–1.0)
Monocytes Relative: 2 % — ABNORMAL LOW (ref 3–12)
Neutro Abs: 16.2 10*3/uL — ABNORMAL HIGH (ref 1.7–7.7)
Neutrophils Relative %: 90 % — ABNORMAL HIGH (ref 43–77)

## 2012-04-13 MED ORDER — OXYCODONE-ACETAMINOPHEN 5-325 MG PO TABS
1.0000 | ORAL_TABLET | ORAL | Status: DC | PRN
  Administered 2012-04-13: 1 via ORAL
  Administered 2012-04-13 (×2): 2 via ORAL
  Administered 2012-04-13: 1 via ORAL
  Administered 2012-04-14 – 2012-04-16 (×6): 2 via ORAL
  Administered 2012-04-17: 1 via ORAL
  Administered 2012-04-17 (×3): 2 via ORAL
  Administered 2012-04-18 (×3): 1 via ORAL
  Filled 2012-04-13 (×3): qty 2
  Filled 2012-04-13: qty 1
  Filled 2012-04-13 (×2): qty 2
  Filled 2012-04-13: qty 1
  Filled 2012-04-13: qty 2
  Filled 2012-04-13 (×2): qty 1
  Filled 2012-04-13: qty 2
  Filled 2012-04-13 (×2): qty 1
  Filled 2012-04-13 (×4): qty 2
  Filled 2012-04-13: qty 1

## 2012-04-13 MED ORDER — CYCLOBENZAPRINE HCL 10 MG PO TABS
10.0000 mg | ORAL_TABLET | Freq: Three times a day (TID) | ORAL | Status: DC | PRN
  Administered 2012-04-15 – 2012-04-17 (×4): 10 mg via ORAL
  Filled 2012-04-13 (×5): qty 1

## 2012-04-13 MED ORDER — HYDROCODONE-ACETAMINOPHEN 10-325 MG PO TABS
1.0000 | ORAL_TABLET | ORAL | Status: DC | PRN
  Administered 2012-04-15 – 2012-04-16 (×6): 1 via ORAL
  Filled 2012-04-13 (×8): qty 1

## 2012-04-13 NOTE — Progress Notes (Signed)
Subjective: Patient resting in bed, moderate incisional pain. Denies nausea.  Objective: Vital signs in last 24 hours: Filed Vitals:   04/13/12 0400 04/13/12 0500 04/13/12 0600 04/13/12 0700  BP: 141/61 137/62 136/61 139/68  Pulse: 82 87 74 69  Temp: 98.8 F (37.1 C)     TempSrc: Oral     Resp: 10 12 10 9   Weight:      SpO2: 96% 97% 97% 96%    Intake/Output from previous day: 07/22 0701 - 07/23 0700 In: 4807.9 [I.V.:4797.9; IV Piggyback:10] Out: 2780 [Urine:2580; Blood:200] Intake/Output this shift:    Physical Exam:  Awake and alert, oriented. Following commands with all 4 extremities. Extraocular movements intact. Facial movements symmetrical.  CBC  Basename 04/13/12 0325  WBC 18.1*  HGB 12.4  HCT 36.2  PLT 196   BMET  Basename 04/13/12 0325  NA 137  K 3.7  CL 103  CO2 22  GLUCOSE 143*  BUN 9  CREATININE 0.55  CALCIUM 8.8    Assessment/Plan: Stable following surgery yesterday. We'll DC a long, central line, and Foley. We'll begin out of bed to chair and progress to ambulate in ICU. We'll begin clear liquids and advanced to regular diet. We'll begin by mouth analgesics as well as Flexeril. Spoke with patient through her daughter and the nurse both of who speaks Spanish. The patient's and her daughter's questions were answered for them.   Hewitt Shorts, MD 04/13/2012, 8:04 AM

## 2012-04-13 NOTE — Plan of Care (Signed)
Problem: Consults Goal: Diagnosis - Craniotomy Outcome: Completed/Met Date Met:  04/13/12 Suboccipical craniectomy for Chiari malformation

## 2012-04-14 MED ORDER — PANTOPRAZOLE SODIUM 40 MG PO TBEC
40.0000 mg | DELAYED_RELEASE_TABLET | Freq: Every day | ORAL | Status: DC
  Administered 2012-04-14 – 2012-04-18 (×5): 40 mg via ORAL
  Filled 2012-04-14 (×4): qty 1

## 2012-04-14 NOTE — Progress Notes (Signed)
Subjective: Patient resting in bed, comfortable. Has been up and ambulating in ICU. No nausea or vomiting. Taking well by mouth.  Objective: Vital signs in last 24 hours: Filed Vitals:   04/14/12 1400 04/14/12 1500 04/14/12 1549 04/14/12 1800  BP: 131/59 151/62 148/66 146/68  Pulse: 77 54 76 78  Temp:   98.5 F (36.9 C) 98.6 F (37 C)  TempSrc:   Oral Oral  Resp: 25 13 18 19   Weight:      SpO2: 95% 92% 95% 98%    Intake/Output from previous day: 07/23 0701 - 07/24 0700 In: 1460 [P.O.:360; I.V.:1090; IV Piggyback:10] Out: 675 [Urine:675] Intake/Output this shift:    Physical Exam:  Patient awake and alert, oriented. Pupils equal round and reactive to light. Extra ocular movements intact. Facial movements symmetrical. Moving all 4 extremities well.  The wound dressing changed, wound healing nicely.  Assessment/Plan: Making good progress postoperatively. We'll discontinue Decadron. We'll transfer to neurosurgical MedSurg unit.   Hewitt Shorts, MD 04/14/2012, 7:29 PM

## 2012-04-15 NOTE — Progress Notes (Signed)
Filed Vitals:   04/15/12 0216 04/15/12 0500 04/15/12 0657 04/15/12 1023  BP: 142/67  139/67 154/60  Pulse: 54  54 66  Temp: 97.5 F (36.4 C)  98.2 F (36.8 C) 97.1 F (36.2 C)  TempSrc: Oral  Oral Oral  Resp: 20  20 18   Height:  5\' 5"  (1.651 m)    Weight:      SpO2: 94%  97% 96%     Patient complaining a varying amount of headache and incisional pain. Responding to Percocet. Limited ambulation, encouraged to increase. Dressing removed, wound clean and dry.  Plan: Will allow to shower with assistance.  Hewitt Shorts, MD 04/15/2012, 1:34 PM

## 2012-04-16 ENCOUNTER — Inpatient Hospital Stay (HOSPITAL_COMMUNITY): Payer: Managed Care, Other (non HMO)

## 2012-04-16 MED ORDER — SENNOSIDES-DOCUSATE SODIUM 8.6-50 MG PO TABS
1.0000 | ORAL_TABLET | Freq: Every morning | ORAL | Status: DC
  Administered 2012-04-16 – 2012-04-17 (×2): 1 via ORAL
  Filled 2012-04-16 (×2): qty 1

## 2012-04-16 MED ORDER — POLYETHYLENE GLYCOL 3350 17 G PO PACK
17.0000 g | PACK | Freq: Every day | ORAL | Status: DC
  Administered 2012-04-16 – 2012-04-17 (×2): 17 g via ORAL
  Filled 2012-04-16 (×3): qty 1

## 2012-04-16 NOTE — Care Management Note (Signed)
    Page 1 of 1   04/16/2012     12:34:49 PM   CARE MANAGEMENT NOTE 04/16/2012  Patient:  Jamie Montes, Jamie Montes   Account Number:  [id number]  Date Initiated:  04/13/2012  Documentation initiated by:  Jacquelynn Cree  Subjective/Objective Assessment:   Admitted postop suboccipital craniotomy, cervical laminectomy     Action/Plan:   Anticipated DC Date:  04/16/2012   Anticipated DC Plan:  HOME W HOME HEALTH SERVICES      DC Planning Services  CM consult      Choice offered to / List presented to:             Status of service:  In process, will continue to follow Medicare Important Message given?   (If response is "NO", the following Medicare IM given date fields will be blank) Date Medicare IM given:   Date Additional Medicare IM given:    Discharge Disposition:    Per UR Regulation:  Reviewed for med. necessity/level of care/duration of stay  If discussed at Long Length of Stay Meetings, dates discussed:    Comments:  04/16/12 Onnie Boer, RN, BSN 1232 PT ADMITTED FOR A CRANI AND LAMI.  PTA PT WAS AT HOME WITH SELF CARE.  PT IS AMBULATING AND SHOWERING.  PT WILL NEED ANOTHER CT SCAN ACCORDING TO ATTENDING.  WILL F/U ON DC NEEDS AND RECOMMENDATIONS.

## 2012-04-16 NOTE — Progress Notes (Signed)
Filed Vitals:   04/15/12 2147 04/16/12 0204 04/16/12 0625 04/16/12 1041  BP: 150/70 147/67 162/78 160/74  Pulse: 65 64 63 64  Temp: 98 F (36.7 C) 97.8 F (36.6 C) 97.8 F (36.6 C) 97.8 F (36.6 C)  TempSrc: Oral Oral Oral Oral  Resp: 18 20 20 16   Height:      Weight:      SpO2: 95% 94% 97% 100%    Patient resting in bed. Had been up and living earlier, but has developed a significant vertex headache. Wound is clean and dry. Patient did shower earlier this morning.  Plan: We'll check CT brain without to evaluate ventricular size and overall intracranial appearance in light of headache following Chiari decompression. Patient to ambulate as feasible, once headache is improved.  Hewitt Shorts, MD 04/16/2012, 12:18 PM

## 2012-04-17 MED ORDER — METHYLPREDNISOLONE 4 MG PO KIT
4.0000 mg | PACK | ORAL | Status: AC
  Administered 2012-04-17: 4 mg via ORAL

## 2012-04-17 MED ORDER — METHYLPREDNISOLONE 4 MG PO KIT: 8.0000 mg | PACK | Freq: Every evening | ORAL | Status: DC

## 2012-04-17 MED ORDER — METHYLPREDNISOLONE 4 MG PO KIT
8.0000 mg | PACK | Freq: Every evening | ORAL | Status: AC
  Administered 2012-04-17: 8 mg via ORAL

## 2012-04-17 MED ORDER — METHYLPREDNISOLONE 4 MG PO KIT
4.0000 mg | PACK | Freq: Three times a day (TID) | ORAL | Status: DC
  Administered 2012-04-18 (×2): 4 mg via ORAL

## 2012-04-17 MED ORDER — METHYLPREDNISOLONE 4 MG PO KIT: 4.0000 mg | PACK | Freq: Four times a day (QID) | ORAL | Status: DC

## 2012-04-17 MED ORDER — METHYLPREDNISOLONE 4 MG PO KIT
8.0000 mg | PACK | Freq: Every morning | ORAL | Status: AC
  Administered 2012-04-17: 8 mg via ORAL
  Filled 2012-04-17: qty 21

## 2012-04-17 NOTE — Progress Notes (Signed)
Filed Vitals:   04/16/12 2254 04/17/12 0217 04/17/12 0558 04/17/12 0941  BP: 113/67 135/69 149/58 140/84  Pulse: 60 67 63 79  Temp: 97.9 F (36.6 C) 98.2 F (36.8 C) 97.7 F (36.5 C) 97.8 F (36.6 C)  TempSrc: Oral Oral Oral Oral  Resp: 20 18 18 16   Height:      Weight:      SpO2: 96% 95% 94% 95%    Patient resting in bed, still having a lot of headache, her nurse Morrie Sheldon notes that she's also has not had a bowel movement since admission. She ambulated only twice yesterday, and has not yet ambulated today.  We obtained a CT scan of the brain without contrast yesterday which shows no evidence of intracranial mass effect, hemorrhage, or shift, although there is some mild postsurgical change.  I've recommended a Ducolax suppository for constipation. I've encouraged to increase ambulation. And I'm going to add a Medrol Dosepak to see if that will help with some of the headache pain that she's been having.   Plan: Spoke with the patient via her daughter functioning as a Nurse, learning disability. Patient's questions and her family's questions were answered for them and her plan of care has been detailed for them. I've encouraged her to be increasing her ambulation.  I've explained to them that I will be out of town for about a week and a half, and if she does not improve enough to be discharged tomorrow we will sign her care to my partner Dr. Coletta Memos, until I return.  Hewitt Shorts, MD 04/17/2012, 10:49 AM

## 2012-04-18 MED ORDER — OXYCODONE-ACETAMINOPHEN 5-325 MG PO TABS: 1.0000 | ORAL_TABLET | ORAL | Status: AC | PRN

## 2012-04-18 NOTE — Discharge Planning (Signed)
Patient to be discharged today will require a wide rolling walker upon discharge.  Call placed to case manager, Briscoe Burns. Left message on her phone.

## 2012-04-18 NOTE — Discharge Summary (Signed)
Physician Discharge Summary  Patient ID: Jamie Montes MRN: [id number] DOB/AGE: Jan 20, 1956 56 y.o.  Admit date: 04/12/2012 Discharge date: 04/18/2012  Admission Diagnoses:  Chiari malformation  Discharge Diagnoses: Chiari malformation  Discharged Condition: good  Hospital Course: Patient was admitted underwent a suboccipital craniectomy, upper cervical laminectomy, and duraplasty. She has done well following surgery. Her Decadron was rapidly tapered and discontinued. She had a lot of difficulty with headache subsequently. CT scan of the head was checked, and there was no hydrocephalus nor evidence of intracranial hemorrhage. She was started on a Medrol Dosepak and is much improved. She's been up and living in the halls with a rolling walker, we've requested a rolling walker for her to have for use at home. Her wound is healing nicely and I've asked the nursing staff to remove her staples prior to discharge. She is to return for followup with me in 3 weeks. She is being discharged home on Percocet as well as a tapered Medrol Dosepak, she is to transition to Tylenol and/or Advil when comfortable enough to do so, and she is to continue on her metoprolol for her blood pressure. She's been given instructions regarding wound care and activities following discharge (via her daughter who acted as Nurse, learning disability) and I've answered questions that she and her daughter raised.  Discharge Exam: Blood pressure 136/63, pulse 71, temperature 97.7 F (36.5 C), temperature source Oral, resp. rate 20, height 5\' 5"  (1.651 m), weight 114.4 kg (252 lb 3.3 oz), last menstrual period 09/13/2011, SpO2 98.00%.  Disposition: Home   Medication List  As of 04/18/2012 10:04 AM   STOP taking these medications         gabapentin 100 MG capsule         TAKE these medications         ibuprofen 200 MG tablet   Commonly known as: ADVIL,MOTRIN   Take 200 mg by mouth every 6 (six) hours as needed.      METOPROLOL SUCCINATE ER  PO   Take 100 mg by mouth daily.      oxyCODONE-acetaminophen 5-325 MG per tablet   Commonly known as: PERCOCET/ROXICET   Take 1-2 tablets by mouth every 4 (four) hours as needed for pain.             SignedHewitt Shorts, MD 04/18/2012, 10:04 AM

## 2012-04-18 NOTE — Progress Notes (Deleted)
Patient, Jamie Montes, was a patient within our institution from 04/12/12 to 04/18/12.  Her daughter, Albin Fischer, was present at bedside to assist with care and translation during her mother's stay.

## 2012-04-18 NOTE — Progress Notes (Signed)
Pt being discharged. IV removed tip intact. Dc papers discussed and signed. Pt and family taught incision care and pain management. Pt encouraged to use stool softener as needed while on narcotic pain medications. Pt left via wheelchair with family. Percocet prescription given to pt.

## 2012-07-06 ENCOUNTER — Encounter: Payer: Self-pay | Admitting: Gynecology

## 2012-07-09 ENCOUNTER — Other Ambulatory Visit (HOSPITAL_COMMUNITY)
Admission: RE | Admit: 2012-07-09 | Discharge: 2012-07-09 | Disposition: A | Discharge status: Home / Self Care | Payer: Managed Care, Other (non HMO) | Source: Ambulatory Visit | Attending: Gynecology | Admitting: Gynecology

## 2012-07-09 ENCOUNTER — Encounter: Payer: Self-pay | Admitting: Gynecology

## 2012-07-09 ENCOUNTER — Ambulatory Visit (INDEPENDENT_AMBULATORY_CARE_PROVIDER_SITE_OTHER): Payer: Managed Care, Other (non HMO) | Admitting: Gynecology

## 2012-07-09 VITALS — BP 136/88 | Ht 62.0 in | Wt 252.0 lb

## 2012-07-09 DIAGNOSIS — N871 Moderate cervical dysplasia: Secondary | ICD-10-CM

## 2012-07-09 DIAGNOSIS — Z01419 Encounter for gynecological examination (general) (routine) without abnormal findings: Secondary | ICD-10-CM

## 2012-07-09 DIAGNOSIS — K635 Polyp of colon: Secondary | ICD-10-CM | POA: Insufficient documentation

## 2012-07-09 DIAGNOSIS — Z8639 Personal history of other endocrine, nutritional and metabolic disease: Secondary | ICD-10-CM

## 2012-07-09 DIAGNOSIS — Z23 Encounter for immunization: Secondary | ICD-10-CM

## 2012-07-09 DIAGNOSIS — R635 Abnormal weight gain: Secondary | ICD-10-CM

## 2012-07-09 DIAGNOSIS — Z1151 Encounter for screening for human papillomavirus (HPV): Secondary | ICD-10-CM | POA: Insufficient documentation

## 2012-07-09 DIAGNOSIS — D126 Benign neoplasm of colon, unspecified: Secondary | ICD-10-CM

## 2012-07-09 DIAGNOSIS — I10 Essential (primary) hypertension: Secondary | ICD-10-CM

## 2012-07-09 DIAGNOSIS — N951 Menopausal and female climacteric states: Secondary | ICD-10-CM

## 2012-07-09 DIAGNOSIS — Z833 Family history of diabetes mellitus: Secondary | ICD-10-CM

## 2012-07-09 DIAGNOSIS — A630 Anogenital (venereal) warts: Secondary | ICD-10-CM

## 2012-07-09 LAB — CBC WITH DIFFERENTIAL/PLATELET
Basophils Absolute: 0.1 10*3/uL (ref 0.0–0.1)
Eosinophils Absolute: 0.3 10*3/uL (ref 0.0–0.7)
Eosinophils Relative: 4 % (ref 0–5)
HCT: 37.5 % (ref 36.0–46.0)
Lymphocytes Relative: 40 % (ref 12–46)
MCH: 29 pg (ref 26.0–34.0)
MCHC: 33.6 g/dL (ref 30.0–36.0)
MCV: 86.2 fL (ref 78.0–100.0)
Monocytes Absolute: 0.4 10*3/uL (ref 0.1–1.0)
Platelets: 222 10*3/uL (ref 150–400)
RDW: 14.2 % (ref 11.5–15.5)
WBC: 6.9 10*3/uL (ref 4.0–10.5)

## 2012-07-09 LAB — TSH: TSH: 2.714 u[IU]/mL (ref 0.350–4.500)

## 2012-07-09 LAB — GLUCOSE, RANDOM: Glucose, Bld: 88 mg/dL (ref 70–99)

## 2012-07-09 LAB — LIPID PANEL
Cholesterol: 189 mg/dL (ref 0–200)
HDL: 47 mg/dL (ref >39)
Total CHOL/HDL Ratio: 4 Ratio
Triglycerides: 220 mg/dL — ABNORMAL HIGH (ref <150)

## 2012-07-09 NOTE — Patient Instructions (Signed)
Menopausia  (Menopause)  La menopausia es el momento normal de la vida en que los períodos menstruales cesan completamente. Se considera definitiva cuando no ha habido períodos durante 12 meses consecutivos. Generalmente ocurre entre los 48 y los 55 años, y el promedio son los 51 años. En muy raras ocasiones se produce antes de los 40 años. En este momento, los ovarios dejan de producir hormonas femeninas, estrógenos y progesterona. Esto puede causar síntomas indeseables y también afectar su salud. En algunos casos los síntomas pueden ocurrir entre 4 a 5 años antes del comienzo de la menopausia. No hay relación entre el uso de anticonceptivos orales, el número de hijos que ha tenido, la raza o la edad en que los períodos menstruales comenzaron (menarca). Las mujeres muy fumadoras y las muy delgadas pueden desarrollarla precozmente.  CAUSAS  · Los ovarios dejan de producir hormonas femeninas, estrógenos y progesterona.  · Otras causas son:  · Cirugía en la que se extirpan ambos ovarios.  · Los ovarios dejan de funcionar por causa desconocida.  · Tumores en la glándula pituitaria, ubicada en el cerebro.  · Enfermedades que afectan los ovarios y la producción de hormonas.  · Tratamiento de radioterapia en el abdomen o en la pelvis.  · Quimioterapia que afecta los ovarios.  SÍNTOMAS  · Sofocos.  · Sudoración nocturna.  · Disminución del impulso sexual.  · Sequedad vaginal y disminución del tamaño de los órganos genitales, lo que causa relaciones sexuales dolorosas.  · Sequedad de la piel y aparición de arrugas.  · Cefaleas.  · Cansancio.  ·  Irritabilidad.  · Problemas de memoria.  · Aumento de peso.  · Infecciones urinarias.  · Crecimiento del vello en el rostro y el pecho.  · Infertilidad.  Otros sintomas más graves son:  · Pérdida de masa ósea (osteoporosis), lo que causa fracturas de huesos.  · Depresión.  · Endurecimiento y estrechamiento de las arterias (aterosclerosis), lo que puede ocasionar infartos e  ictus.  DIAGNÓSTICO  · Cuando el período menstrual falta durante 12 meses corridos.  · Exámenes físicos  · Estudios hormonales de sangre.  TRATAMIENTO  Hay muchas opciones de tratamiento y casi tantas preguntas como opciones existen. La decisión de tratar o no los cambios que trae la menopausia es una decisión que realiza el profesional de acuerdo con cada persona en particular. El médico comentará el tratamiento con usted. Juntos pueden decidir que tratamiento será el mejor, por ejemplo:  · Tratamiento de reemplazo hormonal.  · Tratamiento de los síntomas individuales con medicamentos (por ejemplo tranquilizantes para la depresión).  · Hierbas que pueden ayudar en algunos síntomas específicos.  · Psicoterapia con un psiquiatra o un psicólogo.  · Terapia grupal.  · No recibir tratamiento.  INSTRUCCIONES PARA EL CUIDADO DOMICILIARIO  · Tome los medicamentos según las indicaciones.  · Descanse y duerma lo suficiente.  · Practicar ejercicios con regularidad.  · Consuma una dieta rica en calcio (buena para los huesos) y soja (actúa como un estrógeno).  · Evite las bebidas alcohólicas.  · No fume.  · El consumo de vitamina E puede ayudar en ciertos casos.  · Si tiene sofocos, vístase en capas.  · Tome suplementos de calcio y vitamina D para fortalecer los huesos.  · Puede usar cremas de venta libre para la sequedad vaginal.  · En algunos casos es de gran ayuda la terapia grupal.  · También puede ser de utilidad la acupuntura.  SOLICITE ANTENCIÓN MÉDICA SI:  · No está segura   psiquiatra o psiclogo) para Pensions consultant. SOLICITE ATENCIN MDICA DE INMEDIATO  SI:  Sufre una depresin severa.  Tiene una hemorragia vaginal abundante.  Se ha cado y piensa que se ha roto un hueso.  Siente dolor al [employer].  Siente dolor en el pecho o en la pierna.  Siente latidos cardacos acelerados (palpitaciones)  Tiene dolores de cabeza intensos.  Desarrolla trastornos visuales.  Siente un bulto en el pecho.  Tiene dolor abdominal, o sufre una indigestin grave. Document Released: 10/20/2006 Document Revised: 12/01/2011 [facility] Patient Information 2013 [place], [place].  Terapia de reemplazo hormonal (Hormone Replacement Therapy) En la menopausia, su cuerpo comienza a producir menos estrgeno y Education officer, museum. Esto provoca que el cuerpo deje de tener perodos [place]. Esto se debe a que el estrgeno y la progesterona controlan sus perodos y su ciclo menstrual. Neomia Dear falta de estrgeno puede causar sntomas tales como:  Research scientist (life sciences).   Sequedad vaginal   Piel seca.   Prdida del deseo sexual.   Riesgo de prdida de hueso (osteoporosis).  Cuando esto ocurre, puede elegir realizar Neomia Dear terapia hormonal para volver a Radiographer, therapeutic estrgeno perdido [employer]. Cuando slo se introduce esta hormona, el procedimiento se conoce normalmente como TRE (terapia de reemplazo de [place]). Cuando la hormona progestina se combina con el estrgeno, el procedimiento se conoce normalmente como TH (terapia hormonal). Esto es lo que previamente se conoca como terapia de reemplazo hormonal (TRH). El profesional que le asiste le ayudar a tomar una decisin acerca de lo que resulte lo mejor para usted. La decisin de realizar una TRH cambia a menudo debido a que se Engineer, agricultural. Muchos estudios no ponen de acuerdo con respecto a los beneficios de Education officer, environmental una terapia de reemplazo hormonal.  BENEFICIOS PROBABLES DE LA TRH QUE INCLUYEN PROTECCIN CONTRA:  Golpes de calor - Un golpe de calor es la sensacin repentina de calor sobre la cara y el  cuerpo. La piel enrojece, como al sonrojarse. Estn asociados con la transpiracin y los trastornos del sueo. Las mujeres que atraviesan la menopausia pueden tener golpes de calor unas pocas veces en el mes o varios al da; esto depende de la mujer.   Osteoporosis (prdida de hueso) - El estrgeno ayuda a protegerse contra la prdida de [place]. Luego de la [place], los huesos de una mujer pierden calcio y se vuelven frgiles y Air traffic controller. Como resultado, es ms probable que el hueso se [place]. Los que resultan afectados con mayor frecuencia son los de la cadera, la [place] y la columna vertebral. La terapia hormonal puede ayudar a retardar la prdida de hueso luego de la menopausia. Realizar ejercicios con peso y tomar calcio con vitamina D tambin puede ayudar a prevenir la prdida de [place]. Existen medicamentos que puede prescribir el profesional que la asiste para ayudar a prevenir la osteoporosis.   Sequedad vaginal - La prdida de estrgeno produce cambios en la vagina. El recubrimiento de la misma puede volverse fino y Nurse, learning disability. Estos cambios pueden causar dolor y sangrado durante las relaciones sexuales. La sequedad tambin puede producir una infeccin. Puede ocasionarle ardor y picazn.   Las infecciones en las vas urinarias son ms comunes luego de la menopausia debido a la falta de Astronomer.   Otros beneficios posibles del estrgeno incluyen un cambio positivo en el humor y en la memoria de corto plazo en las mujeres.  EFECTOS SECUNDARIOS Y RIESGOS  Utilizar estrgeno slo sin progesterona causa que el recubrimiento del tero crezca. Esto aumenta el riesgo  de cncer endometrial. El profesional que la asiste deber darle otra hormona llamada progestina, si usted tiene tero.   Las mujeres que realizan una TH combinada (estrgeno y progestina) parecen tener un mayor riesgo de sufrir cncer de mama. El riesgo parece ser [place], [place] aumenta a lo largo del tiempo que se realice la [place].   La  terapia combinada tambin hace que el tejido mamario sea levemente ms denso, lo que hace que sea ms difcil leer mamografas (radiografas de mama).   Combinada, la terapia de estrgeno y Education officer, museum puede realizarse todos [address], en cuyo caso podrn Social worker de [place]. La TH puede realizarse de [place] cclica, en cuyo caso tendr perodos menstruales.   La TH puede aumentar el riesgo de [place], ataque cardaco, cncer de mama y formacin de cogulos en la pierna.  TRATAMIENTO  Si decide realizar Neomia Dear TH y tiene tero, normalmente se prescribe el uso de estrgeno y progestina.   El profesional que la asiste le ayudar a decidir la mejor forma de [employer].   Lo mejor es Occupational hygienist dosis posible que pueda ayudarla con sus sntomas y tomarlos durante la menor cantidad de tiempo posible.   La terapia hormonal puede ayudar a Best boy de los problemas (sntomas) que afectan a las mujeres durante la menopausia. Antes de tomar una decisin con respecto a la TH, converse con el profesional que la asiste acerca de qu es lo mejor para usted. Mantngase bien informada y sintase cmoda con sus decisiones.  INSTRUCCIONES PARA EL CUIDADO DOMICILIARIO:  Siga las indicaciones del profesional con respecto a cmo Recruitment consultant.   Hgase controles de Pelican regular, e incluya [place] y [place].  SOLICITE ATENCIN MDICA DE INMEDIATO SI PRESENTA:  Hemorragia vaginal anormal.   Dolor o inflamacin en las piernas, falta de aliento o Journalist, newspaper.   Mareos o dolores de [place].   Protuberancias o cambios en sus mamas o axilas.   Pronunciacin inarticulada.   Debilidad o adormecimiento en los brazos o las piernas.   Dolor, ardor o sangrado al [employer].   Dolor abdominal.  Document Released: 02/25/2008 Document Revised: 08/28/2011 [facility] Patient Information 2012 [place], [place].  Vacuna difteria/ttanos (Td) o [place] difteria, ttanos, tos  convulsa (Tdap), Lo que debe saber (Tetanus, Diphtheria [Td] or Tetanus, Diphtheria, Pertussis [Tdap] Vaccine, What You Need to Know) PORQU VACUNARSE? El ttanos , la difteria y la tos ferina pueden ser enfermedades graves.  El TTANOS  (trismo) provoca la contraccin dolorosa y rigidez de los msculos, por lo general, en todo el cuerpo.   Puede causar la contraccin de los msculos de la cabeza y el cuello de modo que el enfermo no puede abrir la boca ni tragar., y en algunos casos, tampoco puede respirar.. El ttanos causa la muerte de 1 de cada 5 personas que se infectan.  LA DIFTERIA produce la formacin de una membrana gruesa que cubre el fondo de la garganta.  Puede causar problemas respiratorios, parlisis, insuficiencia cardaca, e incluso la muerte.  El PERTUSIS (tos [place]) causa ataques de tos intensa que pueden dificultar la respiracin, provocar vmitos e interrumpir el sueo.   Puede causar prdida de peso, incontinencia, fractura de [place], y desmayos por la intensa tos. Hasta de 2 de cada 100 adolescentes y 5 de cada 100 adultos que enferman de tos [place] deben ser hospitalizados o tienen complicaciones como la neumona y la [place].  Estas 3 enfermedades son provocadas por bacterias. La difteria y la  tos ferina se Patent examiner de persona a Social worker. El ttanos ingresa al organismo a travs de cortes, rasguos o heridas. En los Estados [place] ocurran alrededor de 200 000 casos por ao de difteria y tos [place], antes de que existieran las [place], y tambin ocurran cientos de casos de ttanos. Desde la aparicin de las vacunas, el ttanos y la difteria han disminuido en alrededor del 99% y los casos de tos ferina disminuyeron aproximadamente el 92%.  Los nios menores de 6 aos deben recibir la vacuna DTaP para estar protegidos contra estas tres enfermedades. Pero los [employer], los adolescentes y los adultos tambin necesitan proteccin. VACUNAS PARA ADOLESCENTES Y ADULTOS  Vacunas Tdap y Td  Hay dos vacunas disponibles para proteger de estas enfermedades a nios a Glass blower/designer de los 7aos:   La vacuna Td fue utilizada durante muchos aos. Protege contra el ttanos y la difteria.   La vacuna Tdap fue autorizada en 2005. Es la primera vacuna para adolescentes y adultos que protege contra la tos ferina y el ttanos y la difteria.  Una dosis de refuerzo de la Td se recomienda cada 10 aos. La Tdap se aplica slo una vez.  QU VACUNA DEBO APLICARME Y CUANDO? Las edades de 7 a 18 aos  Dynegy 11 y los 12 aos se recomienda una dosis de Tdap. Esta dosis puede aplicarse desde los 7 aos en los nios que no han recibido una o ms dosis de DTaP anteriormente.   Los nios y adolescentes que no recibieron todas las dosis programadas de DTaP o DTP a los 7 aos deben completar la serie usando una combinacin de Td y Tdap.  Adultos de 19 aos o ms  [employer] adultos deben recibir una dosis de refuerzo de Td cada 10 aos. Los adultos de menos de 65 aos que nunca hayan recibido la Tdap deben reemplazarla por la siguiente dosis de refuerzo. Los adultos a partir de los 65 aos puedenrecibir una dosis de Tdap.   Los adultos (incluyendo las mujeres que podran quedar embarazadas y los adultos mayores de 65 aos) que tienen contacto cercano con un beb menor de 12 meses deben aplicarse una dosis de Tdap para proteger al beb de la tos [place].   Los trabajadores de la salud que tengan contacto directo con pacientes en hospitales o clnicas deben recibir una dosis de Tdap.  Proteccin despus de [place] herida  Es posible que una persona que tenga un corte o quemadura grave necesite una dosis de Td o Tdap para prevenir la infeccin por ttanos. Puede usarse la Tdap en personas que nunca recibieron una dosis. Pero debe usarse la Td, si la Tdap no se encuentra disponible, o para:   Cualquier persona que haya recibido una dosis de Tdap.   Los nios [employer] 7 y los 9 aos que han  [employer] series de DTap anteriormente.   Adultos de 65 aos o ms.  Mujeres embarazadas.   Las mujeres embarazadas que nunca recibieron una dosis de Ddap deben recibirla despus de la 20a semana de gestacin y preferiblemente durante Contractor. trimestre. Si no se aplican la Tdap durante el embarazo, deben recibirla lo antes posible despus del parto. Las mujeres embarazadas que han recibido la Tdap y tienen que aplicarse la vacuna contra el ttanos o la difteria durante el [place], deben recibir la Td.  Las vacunas Tdap y Td pueden ser administradas al mismo tiempo que otras vacunas. ALGUNAS PERSONAS NO DEBEN RECIBIR LA VACUNA O DEBEN  ESPERAR  Las personas que hayan tenido una reaccin alrgica que haya puesto en peligro su vida despus de una dosis de vacuna contra el ttanos, la difteria o la tos ferina no deben recibir Td ni Tdap..   Las personas que tengan alergias graves a algn componente de una vacuna no deben recibir esa vacuna. Informe a su mdico si la persona que recibe la vacuna sufre alergias graves.   Cualquier persona que [employer] Companies en coma o que haya tenido convulsiones dentro de los 7 [address] posteriores despus de una dosis de DTP o DTaP no debe recibir la Tdap, salvo que se encuentre una causa que no fuera la vacuna. Estas personas pueden recibir Td.   Consulte a su mdico si la persona que recibe [place] de las vacunas:   Tiene epilepsia o algn otro problema del sistema nervioso.   Tuvo inflamacin o dolor intenso despus de una dosis de DTP, DTaP, DT, Td, o Tdap.   Ha tenido el sndrome de Scientific laboratory technician (GBS por sus siglas en ingls).  Las personas que sufran una enfermedad moderada o grave el da en que se programa la vacuna, deben esperar a recuperarse para recibir las vacunas Tdap o Td. Por lo general, una persona con una enfermedad leve o fiebre baja puede recibir la vacuna. CULES SON LOS RIESGOS DE LAS VACUNAS TDAP Y TD? Con Cathleen Corti, al igual que con  cualquier [employer] Data, siempre existe un pequeo riesgo de una reaccin alrgica que ponga en peligro la vida o cause otro problema grave. Todo procedimiento mdico, inclusive la vacunacin pueden causar breves episodios de lipotimia o sntomas relacionados (como movimientos espasmdicos). Para evitar los [employer] y las lesiones causadas por las cadas, permanezca sentado o recustese durante los 15 minutos posteriores a la vacunacin. Informe a su mdico si el paciente se siente dbil o mareado, tiene cambios en la visin o siente zumbidos en los odos.  Es mucho ms probable que tener ttanos, difteria, o tos ferina cause problemas ms graves que los provocados por recibir cualquiera de las vacunas Td o Tdap. A continuacin se enumeran los problemas informados despus de las vacunas Td y Tdap. Problemas Leves (perceptibles, pero que no interfirieron con las actividades): Tdap  Dolor (alrededor de 3 de cada 4 adolescentes y 2 de cada 3 adultos).   Enrojecimiento o inflamacin en el sitio de la inyeccin (alrededor de 1 de cada 5).   Fiebre leve de al menos 100.4 F (38 C) (hasta alrededor de 1 cada 25 adolescentes y 1 de cada 100 adultos).   Dolor de cabeza (alrededor de 4 de cada 10 adolescentes y 3 de cada 10 adultos).   Cansancio (alrededor de 1 de cada 3 adolescentes y 1 de cada 4 adultos).   Nuseas, vmitos, diarrea, o dolor de estmago (hasta 1 de cada 4 adolescentes y 1 de cada 10 adultos).   Escalofros, dolores corporales, dolor articular, erupciones, o inflamacin de las glndulas (poco frecuente).  Td  Dolor (hasta alrededor de 8 de cada 10).   Enrojecimiento o inflamacin de la inyeccin (alrededor de 1 de cada 3).   Fiebre leve (hasta alrededor de 1 de cada 5).   Dolor de cabeza o cansancio (poco frecuente).  Problemas Moderados (interfieren con las [place], [place] no requieren atencin mdica): Tdap  Dolor en el sitio de la inyeccin (alrededor de 1 de cada 20  adolescentes y 1 de cada 100 adultos).   Enrojecimiento o inflamacin de la inyeccin (alrededor de 1 de cada  16 adolescentes y 1 de cada 25 adultos).   Fiebre de ms de 102 F (38.9 C) (alrededor de 1 de cada 100 adolescentes y 1 de cada 250 adultos).   Dolor de cabeza (1 de cada 300).   Nuseas, vmitos, diarrea, o dolor de estmago (hasta 3 de cada 100 adolescentes y 1 de cada 100 adultos).  Td  Fiebre de ms de 102 F (38.9 C) (poco comn).  Tdap o Td  Inflamacin de gran extensin en el brazo en el que se aplic la vacuna (hasta 3 de cada 100).  Problemas [place] (no puede realizar [employer]; requiere Psychologist, prison and probation services) Tdap o Td  Inflamacin, dolor intenso, sangrado y enrojecimiento en el brazo, en el sitio de la inyeccin (poco frecuente).  Puede producirse una reaccin alrgica grave despus de cualquier vacuna. Se estima que estas reacciones ocurren en menos de una de cada un milln de dosis. QU PASA SI HAY UNA REACCIN GRAVE? Qu signos debo buscar? Cualquier estado poco habitual, como una reaccin alrgica grave o fiebre alta. Si le produce Runner, broadcasting/film/video grave, se manifestar dentro de algunos minutos a una hora despus de recibir la vacuna. Entre los signos de Automotive engineer grave se encuentran la dificultad para respirar, debilidad, ronquera o sibilancias, latidos cardacos acelerados, urticaria, mareos, palidez, o inflamacin de la garganta. Qu debo hacer?  Comunquese con su mdico o lleve inmediatamente a la persona al mdico.   Dgale a su mdico qu ocurri, la fecha y hora en que sucedi y cundo le aplicaron la vacuna.   Pida a su mdico que informe sobre la reaccin llenando un formulario del Sistema de Informacin de Reacciones Adversos a las Administrator, arts (VAERS, por sus siglas en ingls). O, puede presentar este informe a travs del sitio web de VAERS [email or web address] o puede llamar al [phone].  VAERS no brinda asistencia  mdica. PROGRAMA NACIONAL DE COMPENSACIN DE DAOS POR VACUNAS El [place] de Compensacin de Daos por Vacunas (VICP) fue creado en 1986.  Aquellas personas que consideren que han sufrido un dao como consecuencia de una vacuna y quieren saber ms acerca del programa y como presentar Roslynn Amble, [place] llamar al [phone] o visitar su sitio web en [email or web address]  CMO Roxan Diesel MS INFORMACIN?  El profesional podr darle el prospecto de la vacuna o sugerirle otras fuentes de informacin.   Comunquese con el servicio de salud de su localidad o [address].   Comunquese con los Centros para el control y la prevencin de Child psychotherapist for Disease Control and Prevention , CDC).   Llame al [phone] (1-800-CDC-INFO).   Visite los sitios web de Energy Transfer Partners en [email or web address]  CDC Td and Tdap Interim VIS-Spanish (10/15/10) Document Released: 12/25/2008 Document Revised: 08/28/2011 [facility] Patient Information 2012 [place], [place].

## 2012-07-09 NOTE — Progress Notes (Signed)
Jamie Montes [birth year] [id number]   History:    56 y.o.  for annual gyn exam who was complaining of vasomotor symptoms and vaginal dryness at times. She also is complaining of what she believes is some growth or hemorrhoid. She has not had a menstrual cycle in a year and a half. 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 Her last mammogram was October 2013 review of the report indicated that she had some left breast calcifications and was to be contacted my radiology group for followup for additional views and she has not received any information from the radiologist yet. Dr. Gabriel Earing had been following her for her hypertension. She has not seen him in a year. She had a suboccipital craniectomy with cervical laminectomy by Dr. Newell Coral this year.  Review of her record also indicated that she  has had a history vitamin D deficiency in the pastbut is currently not taking any calcium or vitamin D since her recent surgery.  Review of her record also indicated that she had a colonoscopyin 2012  which demonstrated benigncolonic  polyps her next  colonoscopy followupis  in 5 years.She had a baseline bone density study in 2010 with normal bone mineralization.   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 Contraception: none Last Pap: 2012. Results were: normal Last mammogram: 2013. Results were: See note above  Obstetric History OB History    Grav Para Term Preterm Abortions TAB SAB Ect Mult Living   2 2        2      # Outc Date GA Lbr Len/2nd Wgt Sex Del Anes PTL Lv   1 PAR      SVD      2 PAR      SVD          ROS: A ROS was performed and  pertinent positives and negatives are included in the history.  GENERAL: No fevers or chills. HEENT: No change in vision, no earache, sore throat or sinus congestion. NECK: No pain or stiffness. CARDIOVASCULAR: No chest pain or pressure. No palpitations. PULMONARY: No shortness of breath, cough or wheeze. GASTROINTESTINAL: No abdominal pain, nausea, vomiting or diarrhea, melena or bright red blood per rectum. GENITOURINARY: No urinary frequency, urgency, hesitancy or dysuria. MUSCULOSKELETAL: No joint or muscle pain, no back pain, no recent trauma. DERMATOLOGIC: No rash, no itching, no lesions. ENDOCRINE: No polyuria, polydipsia, no heat or cold intolerance. No recent change in weight. HEMATOLOGICAL: No anemia or easy bruising or bleeding. NEUROLOGIC: No headache, seizures, numbness, tingling or weakness. PSYCHIATRIC: No depression, no loss of interest in normal activity or change in sleep pattern.     Exam: chaperone present  BP 136/88  Ht 5\' 2"  (1.575 m)  Wt 252 lb (114.306 kg)  BMI 46.09 kg/m2  LMP 08/25/2011  Body mass index is 46.09 kg/(m^2).  General appearance : Well developed well nourished female. No acute distress HEENT: Neck supple, trachea midline, no carotid bruits, no thyroidmegaly Lungs: Clear to auscultation, no rhonchi or wheezes, or rib retractions  Heart: Regular rate and rhythm, no murmurs or gallops Breast:Examined in sitting and supine position were symmetrical in appearance, no palpable masses or tenderness,  no skin retraction, no nipple inversion, no nipple discharge, no skin discoloration, no axillary or supraclavicular lymphadenopathy Abdomen: no palpable masses or tenderness, no rebound or guarding Extremities: no edema or skin discoloration or tenderness  Pelvic:  Bartholin, Urethra, Skene Glands: Within normal limits             Vagina: No gross lesions or discharge  Cervix: No gross lesions or discharge  Uterus  anteverted, normal size, shape and consistency,  non-tender and mobile  Adnexa  Without masses or tenderness  Anus and perineum  normal   Rectovaginal  normal sphincter tone without palpated masses or tenderness Perirectal region:  Physical Exam  Genitourinary:                    Hemoccult cards provided for patient to submit to the office for testing     Assessment/Plan:  56 y.o. female for annual exam who was noted to have 2 perirectal condylomas. Patient will return to the office next week to excise it. Also she had a very small one on her medial left thigh. Patient with past history of condyloma acuminatum of the right labia majora in the past. 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 The following labs will be drawn today: [facility] because of her menopausal symptoms. We'll check her CBC, fasting lipid profile, urinalysis, Pap smear, and vitamin D level. Patient was instructed to submit to the office Hemoccult cards for testing. Literature information on the menopause and on hormone replacement therapy was provided in [place]. Patient was to receive the Tdap vaccine today and she will decide about the flu vaccine next week when she returns for her perirectal biopsy. Patient was given information to contact the radiology group for the additional views of the left breast that was recommended.    Ok Edwards MD, 11:28 AM 07/09/2012

## 2012-07-10 LAB — URINALYSIS W MICROSCOPIC + REFLEX CULTURE
Bacteria, UA: NONE SEEN
Bilirubin Urine: NEGATIVE
Casts: NONE SEEN
Crystals: NONE SEEN
Ketones, ur: NEGATIVE mg/dL
Specific Gravity, Urine: 1.016 (ref 1.005–1.030)
Urobilinogen, UA: 0.2 mg/dL (ref 0.0–1.0)

## 2012-07-10 LAB — VITAMIN D 25 HYDROXY (VIT D DEFICIENCY, FRACTURES): Vit D, 25-Hydroxy: 42 ng/mL (ref 30–89)

## 2012-07-13 ENCOUNTER — Ambulatory Visit (INDEPENDENT_AMBULATORY_CARE_PROVIDER_SITE_OTHER): Payer: Managed Care, Other (non HMO) | Admitting: Gynecology

## 2012-07-13 ENCOUNTER — Encounter: Payer: Self-pay | Admitting: Gynecology

## 2012-07-13 VITALS — BP 130/86

## 2012-07-13 DIAGNOSIS — Z23 Encounter for immunization: Secondary | ICD-10-CM

## 2012-07-13 DIAGNOSIS — A630 Anogenital (venereal) warts: Secondary | ICD-10-CM

## 2012-07-13 DIAGNOSIS — R52 Pain, unspecified: Secondary | ICD-10-CM

## 2012-07-13 DIAGNOSIS — E785 Hyperlipidemia, unspecified: Secondary | ICD-10-CM

## 2012-07-13 DIAGNOSIS — Z780 Asymptomatic menopausal state: Secondary | ICD-10-CM

## 2012-07-13 MED ORDER — KETOROLAC TROMETHAMINE 60 MG/2ML IM SOLN
60.0000 mg | Freq: Once | INTRAMUSCULAR | Status: AC
  Administered 2012-07-13: 60 mg via INTRAMUSCULAR

## 2012-07-13 MED ORDER — LIDOCAINE HCL 2 % EX GEL: CUTANEOUS | Status: DC | PRN

## 2012-07-13 NOTE — Progress Notes (Signed)
Patient presented to the office today to discuss her lab results from her annual exam as well as to remove several perirectal condyloma and one located left medial thigh. Patient has been amenorrheic and has started to suffer with vasomotor symptoms. And her last office visit her FSH was tested which was found to be elevated at 65. Her blood sugar, TSH, CBC were all normal. Her lipid profile indicated her total cholesterol was normal at 189 by her triglycerides were elevated at 220 and her VLDL was slightly elevated at 44. Her HDL and LDL were normal as were her ratios. I had previously provided her with literature formation on hormone replacement therapy and on the menopause.  Patient was counseled for excision of perirectal condyloma and one located left medial thigh. The areas were cleansed with Betadine solution. 1% lidocaine was infiltrated at the base of all 3 condylomas. With the use of the LEEP wire loop these lesions were excised and submitted for histological evaluation. The ball electrode was utilized for cauterization/hemostasis. 1% lidocaine gel was then applied to the area topically.  Patient will monitor her symptoms over the course of the next 2 weeks and will will discuss once again hormone replacement therapy depending on the severity of her symptoms. She will probably need to see her primary physician who may need to initiate some form a statin because of her hyperlipidemia and her weight gain. She is in the process of scheduling her followup mammogram. All the above instructions were provided in Spanish and we'll follow accordingly. Of note she did receive Toradol 60 mg IM prior to the procedure.

## 2012-07-14 ENCOUNTER — Encounter: Payer: Self-pay | Admitting: Gynecology

## 2012-07-14 ENCOUNTER — Other Ambulatory Visit: Payer: Self-pay | Admitting: *Deleted

## 2012-07-14 DIAGNOSIS — R928 Other abnormal and inconclusive findings on diagnostic imaging of breast: Secondary | ICD-10-CM

## 2012-07-15 ENCOUNTER — Other Ambulatory Visit: Payer: Self-pay | Admitting: Gynecology

## 2012-07-15 DIAGNOSIS — R928 Other abnormal and inconclusive findings on diagnostic imaging of breast: Secondary | ICD-10-CM

## 2012-07-28 ENCOUNTER — Other Ambulatory Visit: Payer: Self-pay | Admitting: Gynecology

## 2012-07-28 ENCOUNTER — Other Ambulatory Visit: Payer: Self-pay | Admitting: Anesthesiology

## 2012-07-28 DIAGNOSIS — Z1211 Encounter for screening for malignant neoplasm of colon: Secondary | ICD-10-CM

## 2012-08-09 ENCOUNTER — Other Ambulatory Visit: Payer: Self-pay | Admitting: Family Medicine

## 2012-08-09 DIAGNOSIS — R1011 Right upper quadrant pain: Secondary | ICD-10-CM

## 2012-08-11 ENCOUNTER — Ambulatory Visit
Admission: RE | Admit: 2012-08-11 | Discharge: 2012-08-11 | Disposition: A | Discharge status: Home / Self Care | Payer: Managed Care, Other (non HMO) | Source: Ambulatory Visit | Attending: Family Medicine | Admitting: Family Medicine

## 2012-08-11 DIAGNOSIS — R1011 Right upper quadrant pain: Secondary | ICD-10-CM

## 2012-09-27 ENCOUNTER — Other Ambulatory Visit: Payer: Self-pay | Admitting: Neurosurgery

## 2012-09-27 DIAGNOSIS — G935 Compression of brain: Secondary | ICD-10-CM

## 2012-09-30 ENCOUNTER — Ambulatory Visit
Admission: RE | Admit: 2012-09-30 | Discharge: 2012-09-30 | Disposition: A | Discharge status: Home / Self Care | Payer: 59 | Source: Ambulatory Visit | Attending: Neurosurgery | Admitting: Neurosurgery

## 2012-09-30 DIAGNOSIS — G935 Compression of brain: Secondary | ICD-10-CM

## 2012-10-04 ENCOUNTER — Other Ambulatory Visit: Payer: Self-pay | Admitting: Neurosurgery

## 2012-10-04 DIAGNOSIS — G935 Compression of brain: Secondary | ICD-10-CM

## 2013-01-04 ENCOUNTER — Encounter: Payer: Self-pay | Admitting: Gynecology

## 2013-06-20 ENCOUNTER — Other Ambulatory Visit: Payer: Self-pay | Admitting: *Deleted

## 2013-06-20 DIAGNOSIS — R921 Mammographic calcification found on diagnostic imaging of breast: Secondary | ICD-10-CM

## 2013-07-11 ENCOUNTER — Encounter: Payer: Self-pay | Admitting: Gynecology

## 2013-07-14 ENCOUNTER — Encounter: Payer: Self-pay | Admitting: Gynecology

## 2013-07-19 ENCOUNTER — Encounter: Payer: Self-pay | Admitting: Gynecology

## 2013-07-19 ENCOUNTER — Ambulatory Visit (INDEPENDENT_AMBULATORY_CARE_PROVIDER_SITE_OTHER): Payer: Managed Care, Other (non HMO) | Admitting: Gynecology

## 2013-07-19 ENCOUNTER — Other Ambulatory Visit (HOSPITAL_COMMUNITY)
Admission: RE | Admit: 2013-07-19 | Discharge: 2013-07-19 | Disposition: A | Discharge status: Home / Self Care | Payer: 59 | Source: Ambulatory Visit | Attending: Gynecology | Admitting: Gynecology

## 2013-07-19 VITALS — BP 128/70 | Ht 62.0 in | Wt 231.0 lb

## 2013-07-19 DIAGNOSIS — H109 Unspecified conjunctivitis: Secondary | ICD-10-CM

## 2013-07-19 DIAGNOSIS — Z1159 Encounter for screening for other viral diseases: Secondary | ICD-10-CM

## 2013-07-19 DIAGNOSIS — N951 Menopausal and female climacteric states: Secondary | ICD-10-CM

## 2013-07-19 DIAGNOSIS — Z23 Encounter for immunization: Secondary | ICD-10-CM

## 2013-07-19 DIAGNOSIS — Z87410 Personal history of cervical dysplasia: Secondary | ICD-10-CM

## 2013-07-19 DIAGNOSIS — Z01419 Encounter for gynecological examination (general) (routine) without abnormal findings: Secondary | ICD-10-CM | POA: Insufficient documentation

## 2013-07-19 DIAGNOSIS — Z8639 Personal history of other endocrine, nutritional and metabolic disease: Secondary | ICD-10-CM

## 2013-07-19 DIAGNOSIS — Z780 Asymptomatic menopausal state: Secondary | ICD-10-CM

## 2013-07-19 LAB — CBC WITH DIFFERENTIAL/PLATELET
Basophils Absolute: 0 10*3/uL (ref 0.0–0.1)
Basophils Relative: 0 % (ref 0–1)
Eosinophils Relative: 2 % (ref 0–5)
HCT: 40 % (ref 36.0–46.0)
Hemoglobin: 13.5 g/dL (ref 12.0–15.0)
Lymphocytes Relative: 34 % (ref 12–46)
Lymphs Abs: 2.3 10*3/uL (ref 0.7–4.0)
MCHC: 33.8 g/dL (ref 30.0–36.0)
MCV: 85.1 fL (ref 78.0–100.0)
Monocytes Absolute: 0.4 10*3/uL (ref 0.1–1.0)
Monocytes Relative: 5 % (ref 3–12)
Neutro Abs: 4 10*3/uL (ref 1.7–7.7)
RBC: 4.7 MIL/uL (ref 3.87–5.11)
RDW: 13.9 % (ref 11.5–15.5)
WBC: 6.8 10*3/uL (ref 4.0–10.5)

## 2013-07-19 LAB — LIPID PANEL
Cholesterol: 202 mg/dL — ABNORMAL HIGH (ref 0–200)
HDL: 47 mg/dL (ref >39)
LDL Cholesterol: 114 mg/dL — ABNORMAL HIGH (ref 0–99)
Total CHOL/HDL Ratio: 4.3 Ratio
Triglycerides: 203 mg/dL — ABNORMAL HIGH (ref <150)
VLDL: 41 mg/dL — ABNORMAL HIGH (ref 0–40)

## 2013-07-19 LAB — COMPREHENSIVE METABOLIC PANEL
AST: 18 U/L (ref 0–37)
BUN: 9 mg/dL (ref 6–23)
Calcium: 9.5 mg/dL (ref 8.4–10.5)
Chloride: 106 mEq/L (ref 96–112)
Creat: 0.67 mg/dL (ref 0.50–1.10)
Sodium: 135 mEq/L (ref 135–145)

## 2013-07-19 LAB — TSH: TSH: 1.668 u[IU]/mL (ref 0.350–4.500)

## 2013-07-19 LAB — HEPATITIS C ANTIBODY: HCV Ab: NEGATIVE

## 2013-07-19 MED ORDER — MOXIFLOXACIN HCL 0.5 % OP SOLN - NO CHARGE: 1.0000 [drp] | Freq: Three times a day (TID) | OPHTHALMIC | Status: DC

## 2013-07-19 NOTE — Progress Notes (Signed)
Jamie Montes 1956-08-12 [id number]   History:    57 y.o.  for annual gyn exam complaining of irritation and redness on her last eye.  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 Patient also has had a history of LEEP cervical conization 2001 for CIN-2argins were negative. Dr. Gabriel Earing had been following her for her hypertension. She has not seen him in a year.She had a suboccipital craniectomy with cervical laminectomy by Dr. Newell Coral this year.  Review of her record also indicated that she has had a history vitamin D deficiency in the pastbut is currently not taking any calcium or vitamin D since her recent surgery. 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   Patient is menopausal with minimal symptoms not on any hormonal replacement therapy. This year patient had condyloma acuminatum removed from her left medial thigh and has done well.      Past medical history,surgical history, family history and social history were all reviewed and documented in the EPIC chart.  Gynecologic History Patient's last menstrual period was 08/25/2011. Contraception: post menopausal status Last Pap: 2013. Results were: normal Last mammogram: 2014 but small benign calcifications punctated were reported no change from 2013.Marland Kitchen Results were: normal  Obstetric History OB History  Gravida Para Term Preterm AB SAB TAB Ectopic Multiple Living  2 2        2     # Outcome Date GA Lbr Len/2nd Weight Sex Delivery Anes PTL Lv  2 PAR      SVD     1 PAR      SVD          ROS: A ROS was performed and pertinent positives and negatives are included in the history.  GENERAL: No fevers or chills. HEENT: No change in  vision, no earache, sore throat or sinus congestion. NECK: No pain or stiffness.left eye redness CARDIOVASCULAR: No chest pain or pressure. No palpitations. PULMONARY: No shortness of breath, cough or wheeze. GASTROINTESTINAL: No abdominal pain, nausea, vomiting or diarrhea, melena or bright red blood per rectum. GENITOURINARY: No urinary frequency, urgency, hesitancy or dysuria. MUSCULOSKELETAL: No joint or muscle pain, no back pain, no recent trauma. DERMATOLOGIC: No rash, no itching, no lesions. ENDOCRINE: No polyuria, polydipsia, no heat or cold intolerance. No recent change in weight. HEMATOLOGICAL: No anemia or easy bruising or bleeding. NEUROLOGIC: No headache, seizures, numbness, tingling or weakness. PSYCHIATRIC: No depression, no loss of interest in normal activity or change in sleep pattern.     Exam: chaperone present  BP 128/70  Ht 5\' 2"  (1.575 m)  Wt 231 lb (104.781 kg)  BMI 42.24 kg/m2  LMP 08/25/2011  Body mass index is 42.24 kg/(m^2).  General appearance : Well developed well nourished female. No acute distress HEENT: Neck supple, trachea midline, no carotid bruits, no thyroidmegaly,left eye conjunctivitis Lungs: Clear to auscultation, no rhonchi or wheezes, or rib retractions  Heart: Regular rate and rhythm, no murmurs or gallops Breast:Examined in sitting and supine position were symmetrical in appearance, no palpable masses or tenderness,  no skin retraction, no nipple inversion, no nipple discharge, no skin discoloration, no axillary or supraclavicular lymphadenopathy Abdomen: no palpable masses or tenderness, no rebound or guarding Extremities: no edema or skin discoloration or  tenderness  Pelvic:  Bartholin, Urethra, Skene Glands: Within normal limits             Vagina: No gross lesions or discharge  Cervix: No gross lesions or discharge  Uterus  anteverted, normal size, shape and consistency, non-tender and mobile  Adnexa  Without masses or tenderness  Anus and  perineum  normal   Rectovaginal  normal sphincter tone without palpated masses or tenderness             Hemoccult card provided     Assessment/Plan:  57 y.o. female for annual exam with clinical evidence of conjunctivitis. The patient will be placed on Vgamox 0.5% ophthalmic solution to apply one drop to each eye 3 times a day for one week. If she has no improvement over the next 70 2R she will call the office and I will refer to the ophthalmologist. She was normotensive today. Pap smear was done today. She received the flu vaccine today. The following labs were ordered: CBC, fasting lipid profile, comprehensive metabolic panel, TSH, urinalysis as well as vitamin D. She was given Hemoccult cards to submit to the office for testing she will also schedule her bone density study as well.  New CDC guidelines is recommending patients be tested once in her lifetime for hepatitis C antibody who were born between [birth year] through 1963. This was discussed with the patient today and has agreed to be tested today.  The patient which her mind and to followup with her internist as well as to schedule her colonoscopy which is overdue.  Note: This dictation was prepared with  Dragon/digital dictation along withSmart phrase technology. Any transcriptional errors that result from this process are unintentional.   Ok Edwards MD, 2:07 PM 07/19/2013

## 2013-07-19 NOTE — Patient Instructions (Signed)
Conjuntivitis (Conjunctivitis) Usted padece conjuntivitis. La conjuntivitis se conoce frecuentemente como "ojo rojo". Las causas de la conjuntivitis pueden ser las infecciones virales o [place], Environmental consultant o lesiones. Los sntomas son: enrojecimiento de la superficie del ojo, picazn, molestias y en algunos casos, secreciones. La secrecin se deposita en las pestaas. Las infecciones virales causan una secrecin acuosa, mientras que las infecciones bacterianas causan una secrecin amarillenta y espesa. La conjuntivitis es muy contagiosa y se disemina por el contacto directo. [employer] parte del tratamiento le indicaran gotas oftlmicas con antibiticos. Antes de [employer] Corporation, retire todas la secreciones del ojo, lavndolo suavemente con agua tibia y algodn. Contine con el uso del medicamento hasta que se haya Entergy Corporation sin secrecin ocular. No se frote los ojos. Esto hace que aumente la irritacin y favorece la extensin de la infeccin. No utilice las Lear Corporation miembros de [place]. Lvese las manos con agua y [place] antes y despus de tocarse los ojos. Utilice compresas fras para reducir Chief Technology Officer y anteojos de sol para disminuir la irritacin que ocasiona la luz. No debe usarse maquillaje ni lentes de contacto hasta que la infeccin haya desaparecido. SOLICITE ATENCIN MDICA SI:  Sus sntomas no mejoran luego de [address]  [address] de [place].  Aumenta el dolor o las dificultades para ver.  La zona externa de los prpados est muy roja o hinchada. Document Released: 09/08/2005 Document Revised: 12/01/2011 [facility] Patient Information 2014 [place], [place]. Vacuna antigripal (vacuna antigripal inactivada) 2013 2014, Lo que debe saber  (Influenza Vaccine [Flu Vaccine, Inactivated] 2013 2014, What You Need to Know) PORQU VACUNARSE?   La influenza ("gripe") es una enfermedad contagiosa que se propaga por los [place] en invierno, por lo general entre octubre y  [place].  La causa de la gripe es el virus de la influenza, y se puede contagiar por la tos, al estornudar y por el contacto cercano.  Cualquier persona puede Writer gripe, Biomedical engineer el riesgo es mayor entre los nios. Los sntomas aparecen rpidamente y pueden durar [address]. Pueden ser:  Grant Ruts o escalofros.  Dolor de Advertising copywriter.  Dolores musculares.  La fatiga.  Tos.  Dolor de [place].  Secrecin o congestin nasal. La gripe puede hacer que algunas personas se enfermen ms que otros. Entre [employer] se incluyen a los nios pequeos, las Smith International de 65 aos, las mujeres embarazadas y las personas con Runner, broadcasting/film/video, como enfermedades cardacas, pulmonares o renales, o que tienen un sistema inmunolgico debilitado. La vacuna contra la gripe es especialmente importante para estas personas y para todos los que estn en estrecho contacto con ellos.  La gripe tambin puede causar neumona y Theme park manager las afecciones existentes. En los nios, puede provocar diarrea y convulsiones.  Cada ao miles de [employer] [place] debido a la gripe y muchos ms deben ser hospitalizados.  La vacuna contra la gripe es la mejor proteccin que existe contra la gripe y sus complicaciones. La vacuna contra la gripe tambin ayuda a prevenir la propagacin de la gripe de Neomia Dear persona a Educational psychologist.  VACUNA INACTIVADA CONTRA LA GRIPE  Hay dos tipos de vacunas contra la gripe:   Usted recibir la vacuna de la gripe inactivada, que no contiene virus vivo. Se administra en forma de inyeccin con Marella Bile y se llama la "vacuna antigripal".  Otro tipo de vacuna con virus vivos, atenuados (debilitados), se aplica en forma de aerosol en las fosas nasales. Esta vacuna se describe en el apartado Informacin  sobre las vacunas. Se recomienda aplicarse la vacuna contra la gripe todos los [place]. Los nios [employer]ger 6 meses y los 8 aos de edad deben recibir 2 dosis Dispensing optician que se vacunen.  Los  virus de la gripe [place] constantemente. Cada ao, la vacuna contra la gripe se actualiza para proteger contra los virus que tienen ms probabilidades de causar la enfermedad ese ao. Aunque la vacuna no puede prevenir todos los casos de gripe, es nuestra mejor defensa contra la enfermedad. Vacuna contra la gripe inactivada protege contra 3 o 4 virus diferentes.  Se tarda aproximadamente 2 semanas para desarrollar la proteccin despus de la vacunacin y la proteccin dura entre algunos meses y un ao.  Muchas veces se confunden con la gripe algunas enfermedades que no son causadas por el virus de la gripe. La vacuna contra la gripe no previene estas enfermedades. Slo se puede prevenir la gripe.  Para las personas de ms de 65 aos, se dispone de una vacuna contra la gripe de "dosis elevada". La persona que aplica la vacuna puede darle ms informacin al respecto.  Algunas de las vacunas contra la gripe inactivada contienen una cantidad muy pequea de un conservante a base de mercurio llamado timerosal. Algunos estudios han demostrado que el timerosal en las vacunas no es perjudicial, pero se dispone de vacunas contra la gripe que no contienen el conservante.  ALGUNAS PERSONAS NO DEBEN RECIBIR ESTA VACUNA Informe a la persona que le aplica la vacuna:   Si sufre alguna alergia grave (que pone en peligro la vida). Si alguna vez tuvo una reaccin alrgica potencialmente mortal despus de Neomia Dear dosis de la vacuna contra la gripe, o tuvo una alergia grave a cualquiera de los componentes de [place], es posible que se le recomiende no recibir una dosis. La Har[employer]son de las vacunas contra la gripe, aunque no todas, contienen una pequea cantidad de [place].  Si alguna vez ha sufrido el sndrome de Pension scheme manager (una enfermedad paralizante grave tambin llamada GBS). Algunas personas con antecedentes de GBS no deben recibir esta vacuna. Debe comentarlo con su mdico.  Si no se siente bien. Podran sugerirle que  espere hasta sentirse mejor. Pero debe volver. RIESGOS DE UNA REACCIN A LA VACUNA Con la vacuna, como cualquier medicamento, existe la posibilidad de sufrir efectos secundarios. Suelen ser leves y desaparecen por s solos.  Los efectos secundarios graves son Springer, pero son Lynnae Sandhoff raros. Vacuna de la gripe inactivada no contiene el virus vivo de la gripe, la gripe por lo tanto enfermarse por recibir la vacuna no es posible.  Episodios de desmayo leves y sntomas relacionados (tales como sacudidas) pueden presentarse despus de cualquier procedimiento mdico, incluyendo la vacunacin. Si permanece sentado o recostado durante 15 minutos despus de la vacunacin puede ayudar a Lubrizol Corporation y las lesiones causadas por las cadas. Informe al mdico si se siente mareado o aturdido, tiene Allied Waste Industries visin o zumbidos en los odos.  Problemas leves luego de recibir la vacuna de la gripe inactivada:   Barista, enrojecimiento o Paramedic en el que le aplicaron la vacuna.  Ronquera; dolor, inflamacin o picazn en los ojos o tos.  Grant Ruts.  Dolores.  Dolor de [place].  Picazn.  Fatiga. Si estos problemas ocurren, en general comienzan poco despus de vacunarse y duran 1  2 das.  Problemas moderados luego de recibir la vacuna de la gripe inactivada:   Los nios que reciben la vacuna contra  la gripe inactivada y Research scientist (medical) antineumoccica (PCV13) al mismo tiempo, pueden tener un mayor riesgo de sufrir convulsiones causadas por fiebre. Consulte a su mdico para obtener ms informacin. Informe a su mdico si un nio que est recibiendo la vacuna contra la gripe ha tenido una convulsin. Problemas graves luego de recibir la vacuna inactivada contra la gripe:   Neomia Dear reaccin alrgica grave puede ocurrir despus de la administracin de cualquier vacuna (se estima en menos de 1 en un milln de dosis).  Hay una pequea posibilidad de que la vacuna de la gripe  inactivada est asociada con el sndrome de Guillain-Barr (GBS), no ms de 1 o 2 casos por milln de personas vacunadas. Es Chief Operating Officer que el riesgo de sufrir complicaciones graves por la gripe, que puede prevenirse con la vacunacin. Se controla permanentemente la seguridad de las vacunas. Para obtener ms informacin, consulte [email or web address]  QU PASA SI HAY UNA REACCIN GRAVE?  Qu signos debo buscar?   Observe todo lo que le preocupe, como signos de una reaccin alrgica grave, fiebre muy alta o cambios en el comportamiento. Los signos de Runner, broadcasting/film/video grave pueden incluir urticaria, hinchazn de la cara y la garganta, dificultad para respirar, ritmo cardaco acelerado, mareos y debilidad. Pueden comenzar entre unos pocos minutos y algunas horas despus de la vacunacin.  Qu debo hacer?   Si usted piensa que se trata de una reaccin alrgica grave o de otra emergencia que no puede esperar, llame al 911 o lleve a la persona al hospital ms cercano. De lo contrario, llame a su mdico.  Despus, la reaccin debe informarse a la "Vaccine Adverse Event Reporting System" (Sistema de informacin sobre efectos adversos de las vacunas -VAERS). Su mdico puede presentar este informe, o puede hacerlo usted mismo a travs del sitio web de VAERS, en www.vaers.LAgents.no, o llamando al [phone]. VAERS es slo para informar reacciones. No brindan consejo mdico.  PROGRAMA NACIONAL DE COMPENSACIN DE DAOS POR VACUNAS  El National Vaccine Injury Compensation Program (VICP) es un programa federal que fue creado para compensar a las personas que puedan haber sufrido daos al recibir ciertas vacunas.  Aquellas personas que consideren que han sufrido un dao como consecuencia de una vacuna y quieren saber ms acerca del programa y como presentar Roslynn Amble, [place] llamar al [phone] o visitar su sitio web en [email or web address].  CMO PUEDO OBTENER MS INFORMACIN?    Consulte a su mdico.  Comunquese con el servicio de salud de su localidad o [address].  Comunquese con los Centros para el control y la prevencin de Child psychotherapist for Disease Control and Prevention , CDC).  Llame al (712)[phone] (1-800-CDC-INFO) o  Visite la pgina web de los CDC en [email or web address]. CDC Inactivated Influenza Vaccine Interim VIS (04/16/12)  Document Released: 12/05/2008 Document Revised: 06/02/2012 ExitCare Patient Information 2014 [place], [place].

## 2013-07-20 LAB — URINALYSIS W MICROSCOPIC + REFLEX CULTURE
Bacteria, UA: NONE SEEN
Bilirubin Urine: NEGATIVE
Casts: NONE SEEN
Crystals: NONE SEEN
Glucose, UA: NEGATIVE mg/dL
Specific Gravity, Urine: 1.017 (ref 1.005–1.030)
Urobilinogen, UA: 0.2 mg/dL (ref 0.0–1.0)
pH: 6 (ref 5.0–8.0)

## 2013-08-11 ENCOUNTER — Other Ambulatory Visit: Payer: Managed Care, Other (non HMO) | Admitting: Anesthesiology

## 2013-08-11 DIAGNOSIS — Z1211 Encounter for screening for malignant neoplasm of colon: Secondary | ICD-10-CM

## 2013-10-04 ENCOUNTER — Other Ambulatory Visit: Payer: Self-pay | Admitting: Gynecology

## 2013-10-04 ENCOUNTER — Ambulatory Visit (INDEPENDENT_AMBULATORY_CARE_PROVIDER_SITE_OTHER): Payer: Managed Care, Other (non HMO)

## 2013-10-04 DIAGNOSIS — Z13820 Encounter for screening for osteoporosis: Secondary | ICD-10-CM

## 2013-10-04 DIAGNOSIS — Z780 Asymptomatic menopausal state: Secondary | ICD-10-CM

## 2013-10-06 ENCOUNTER — Emergency Department (HOSPITAL_COMMUNITY): Payer: Managed Care, Other (non HMO)

## 2013-10-06 ENCOUNTER — Encounter (HOSPITAL_COMMUNITY): Payer: Self-pay | Admitting: Emergency Medicine

## 2013-10-06 ENCOUNTER — Emergency Department (HOSPITAL_COMMUNITY)
Admission: EM | Admit: 2013-10-06 | Discharge: 2013-10-06 | Disposition: A | Discharge status: Home / Self Care | Payer: Managed Care, Other (non HMO) | Attending: Emergency Medicine | Admitting: Emergency Medicine

## 2013-10-06 DIAGNOSIS — D649 Anemia, unspecified: Secondary | ICD-10-CM | POA: Insufficient documentation

## 2013-10-06 DIAGNOSIS — Z860100 Personal history of colon polyps, unspecified: Secondary | ICD-10-CM | POA: Insufficient documentation

## 2013-10-06 DIAGNOSIS — M1712 Unilateral primary osteoarthritis, left knee: Secondary | ICD-10-CM

## 2013-10-06 DIAGNOSIS — IMO0002 Reserved for concepts with insufficient information to code with codable children: Secondary | ICD-10-CM | POA: Insufficient documentation

## 2013-10-06 DIAGNOSIS — M1710 Unilateral primary osteoarthritis, unspecified knee: Secondary | ICD-10-CM | POA: Insufficient documentation

## 2013-10-06 DIAGNOSIS — Z79899 Other long term (current) drug therapy: Secondary | ICD-10-CM | POA: Insufficient documentation

## 2013-10-06 DIAGNOSIS — E559 Vitamin D deficiency, unspecified: Secondary | ICD-10-CM | POA: Insufficient documentation

## 2013-10-06 DIAGNOSIS — Z8742 Personal history of other diseases of the female genital tract: Secondary | ICD-10-CM | POA: Insufficient documentation

## 2013-10-06 DIAGNOSIS — I10 Essential (primary) hypertension: Secondary | ICD-10-CM | POA: Insufficient documentation

## 2013-10-06 MED ORDER — HYDROCODONE-ACETAMINOPHEN 5-325 MG PO TABS: 1.0000 | ORAL_TABLET | ORAL | Status: DC | PRN

## 2013-10-06 MED ORDER — HYDROCODONE-ACETAMINOPHEN 5-325 MG PO TABS
2.0000 | ORAL_TABLET | Freq: Once | ORAL | Status: AC
  Administered 2013-10-06: 2 via ORAL
  Filled 2013-10-06: qty 2

## 2013-10-06 NOTE — ED Provider Notes (Signed)
Medical screening examination/treatment/procedure(s) were performed by non-physician practitioner and as supervising physician I was immediately available for consultation/collaboration.  EKG Interpretation   None         Osvaldo Shipper, MD 10/06/13 [id number]

## 2013-10-06 NOTE — ED Notes (Addendum)
pts son states that pt has been experiencing sharp pain in her left knee for 4 days. States that she has had some pain in there knee for about 3 weeks. States that she was out of the country in [place] and received an injection for the pain in the left knee while she was there.

## 2013-10-06 NOTE — Discharge Instructions (Signed)
1. Medications: vicodin, usual home medications including your meloxicam 2. Treatment: rest, drink plenty of fluids, gentle stretching as discussed, use ice 3. Follow Up: Please followup with your primary doctor for discussion of your diagnoses and Dr. Percell Miller for further evaluation and discussion of other treatment options   Artritis inespecfica (Arthritis, Nonspecific) La artritis es la inflamacin de una articulacin. Los sntomas son dolor, enrojecimiento, calor o hinchazn. Pueden verse involucradas una o ms articulaciones. Hay diferentes tipos de artritis. El mdico no podr Lobbyist cul es el tipo de artritis que usted sufre.  CAUSAS La causa ms frecuente es el desgaste de la articulacin (osteoartritis). Esto ocasiona lesiones en el cartlago, que puede romperse con [place]. Las zonas ms afectadas por este tipo de artritis son las rodillas, caderas, espalda y cuello. Otros tipos de artritis y causas frecuentes de dolor en la articulacin son:  Esguinces y otras lesiones cercanas a la articulacin}. En algunos casos, esguinces y lesiones menores causan dolor e hinchazn que aparece horas ms tarde.  Artritis reumatoidea [employer], pies y rodillas. Generalmente afecta ambos lados del cuerpo al [employer]. Generalmente se asocia a enfermedades crnicas, fiebre, prdida de peso y debilidad general.  Artritis por cristales. La gota y la pseudogota pueden causar dolor intenso agudo ocasional, enrojecimiento e hinchazn del pie, el tobillo o la rodilla.  Artritis infecciosa. Las bacterias pueden penetrar en la articulacin a travs de una herida en la piel. Esto puede causar una infeccin en la articulacin. Las bacterias y virus tambin pueden diseminarse a travs del torrente sanguneo y Print production planner las articulaciones.  Reacciones a medicamentos, infecciosas y Camera operator. En algunos casos las articulaciones duelen levemente y estn ligeramente hinchadas en este  tipo de enfermedad. SNTOMAS  El dolor es el sntoma principal.  La articulacin tambin pueden verse roja, hinchada y caliente al tacto.  En ciertos tipos de artritis hay fiebre o malestar general.  En la articulacin que presenta artritis sentir dolor con el movimiento. En otros tipos de artritis hay rigidez. DIAGNSTICO: El mdico sospechar artritis basndose en la descripcin de los sntomas y en el examen. Ser necesario realizar pruebas para diagnosticar el tipo de artritis.  Anlisis de [place] y en algunos casos de [place].  Radiografas y en algunos casos tomografa computada o diagnstico por imgenes.  La remocin del lquido de Water engineer (artrocentesis) se realiza para Aeronautical engineer presencia de bacterias, cristales o por otras causas. Su mdico (o un especialista) adormecern la zona de la articulacin con un anestsico local y utilizarn una aguja para retirar lquido de la articulacin para ser examinado. Este procedimiento es slo mnimamente molesto.  An con estas pruebas, el mdico no podr decir qu tipo de artritis usted sufre. La consulta con un especialista (reumatlogo) puede ser de [place]. TRATAMIENTO El mdico comentar con usted el tratamiento especfico para su tipo de artritis. Si el tipo especfico no puede determinarse, podrn aplicarse las siguientes recomendaciones generales.  El tratamiento para el dolor intenso de las articulaciones consiste en:  Hacer reposo  Elevar el [place].  Podrn prescribirle medicamentos antiinflamatorios (como ibuprofeno). Evite las actividades que aumenten Conservation officer, historic buildings.  Slo tome medicamentos de venta libre o prescriptos para Glass blower/designer y las [place], segn las indicaciones de su mdico.  Puede aplicarse compresas fras sobre la articulacin dolorida durante 10 a 15 minutos cada hora. Las compresas calientes tambin pueden ser beneficiosas, pero no las utilice durante la noche. No use compresas calientes sin  autorizacin de su mdico,  si es [place] inyeccin de corticoides en la articulacin artrtica puede ayudar a reducir el dolor y la hinchazn.  Si una artritis aguda [employer] siguientes 1  2 [place], ser necesario descartar una infeccin. El tratamiento prolongado implica la modificacin de [place] y del estilo de vida para reducir Pension scheme manager. Puede ser necesario que baje de [place]. La actividad fsica es necesaria para nutrir Charity fundraiser de la articulacin y Tax adviser. Esto ayuda a Theatre manager fuertes los msculos que rodean Water engineer. INSTRUCCIONES PARA EL CUIDADO DOMICILIARIO  No tome aspirina para Best boy si se sospecha que sufre gota. Esto eleva los niveles de cido rico.  Solo tome medicamentos que se pueden comprar sin receta o recetados para Conservation officer, historic buildings, Tree surgeon o fiebre, como le indica el mdico.  Pence reposo todo el tiempo que pueda.  Si la articulacin est hinchada, mantngala elevada.  Utilice muletas si la articulacin que le duele est en la pierna.  Beber abundante cantidad de lquidos ser beneficioso para ciertos tipos de artritis.  [place] fsica regular puede ser beneficiosa, incluyendo las actividades de bajo impacto como:  [place].  [place].  Andar en bicicleta.  Caminar.  La rigidez matutina se Charleen Kirks con una ducha caliente.  Tambin es beneficioso que realice ejercicios de amplitud de movimiento. SOLICITE ATENCIN MDICA SI:  No se siente mejor o empeora luego de las 24 horas.  Presenta efectos adversos por los medicamentos y no mejora con Dispensing optician. SOLICITE ATENCIN MDICA INMEDIATAMENTE SI:  Tiene fiebre.  Presenta fiebre o dolor intenso, hinchazn o enrojecimiento.  Muchas articulaciones estn involucradas y estn hinchadas y Tree surgeon.  Tiene un dolor intenso en la espalda o siente debilidad en las piernas.  Pierde el control de  la vejiga o del intestino. Document Released: 09/08/2005 Document Revised: 12/01/2011 [facility] Patient Information 2014 [place], [place].

## 2013-10-06 NOTE — ED Notes (Signed)
X 4 days of bilateral knee pain; lt. Knee being the worst;

## 2013-10-06 NOTE — ED Provider Notes (Signed)
CSN: [id number]     Arrival date & time 10/06/13  1816 History   First MD Initiated Contact with Patient 10/06/13 1921     Chief Complaint  Patient presents with  . Knee Pain   (Consider location/radiation/quality/duration/timing/severity/associated sxs/prior Treatment) Patient is a 58 y.o. female presenting with knee pain. The history is provided by the patient and medical records. A language interpreter was used (spanish).  Knee Pain Associated symptoms: no back pain, no fever and no neck pain    Jamie Montes is a 58 y.o. female  with a hx of arthritis, HTN, anemia presents to the Emergency Department complaining of gradual, persistent, progressively worsening left knee pain onset several weeks ago but worsening 4 days ago.  Pt was seen in [place] and given an unknown shot in the left knee which made no difference in her symptoms.  Pt denies fall or known injury.  She takes meloxican for the pain, but it is not providing relief at this time.  Nothing makes it better and walking and weightbearing makes it worse.  Pt denies fever, chills, nausea, vomiting, redness of the joint, increased warmth of the joint.     Past Medical History  Diagnosis Date  . NSVD (normal spontaneous vaginal delivery)     X2  . Condyloma     VULVAR / PERINEAL  . Vitamin D deficiency   . Hypertension   . History of colonic polyps   . Anemia   . Arthritis    Past Surgical History  Procedure Laterality Date  . 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 Dilation and curettage of uterus      IST TRIMESTER SAB  . Resectoscopic polypectomy    . Lipoma excision      LEFT ARM/shoulder  . Suboccipital craniectomy cervical laminectomy  04/12/2012    Procedure: SUBOCCIPITAL CRANIECTOMY CERVICAL LAMINECTOMY/DURAPLASTY;  Surgeon: Hosie Spangle, MD;  Location: [facility];  Service: Neurosurgery;  Laterality: N/A;  suboccipital craniectomy with upper cervical laminectomy  with duroplasty   Family History  Problem Relation Age of Onset  . Hypertension Mother   . Diabetes Mother   . Cancer Maternal Aunt 74    COLON   History  Substance Use Topics  . Smoking status: Never Smoker   . Smokeless tobacco: Never Used  . Alcohol Use: No   OB History   Grav Para Term Preterm Abortions TAB SAB Ect Mult Living   2 2        2      Review of Systems  Constitutional: Negative for fever and chills.  Gastrointestinal: Negative for nausea and vomiting.  Musculoskeletal: Positive for arthralgias and joint swelling. Negative for back pain, neck pain and neck stiffness.  Skin: Negative for wound.  Neurological: Negative for numbness.  Hematological: Does not bruise/bleed easily.  Psychiatric/Behavioral: The patient is not nervous/anxious.   All other systems reviewed and are negative.    Allergies  Review of patient's allergies indicates no known allergies.  Home Medications   Current Outpatient Rx  Name  Route  Sig  Dispense  Refill  . metoprolol succinate (TOPROL-XL) 100 MG 24 hr tablet   Oral   Take 100 mg by mouth daily. Take with or immediately following a meal.         . HYDROcodone-acetaminophen (NORCO/VICODIN) 5-325 MG per tablet   Oral   Take 1-2 tablets by mouth every 4 (four) hours  as needed.   21 tablet   0    BP 158/70  Pulse 85  Temp(Src) 98.5 F (36.9 C) (Oral)  Resp 16  SpO2 95%  LMP 08/25/2011 Physical Exam  Nursing note and vitals reviewed. Constitutional: She appears well-developed and well-nourished. No distress.  HENT:  Head: Normocephalic and atraumatic.  Eyes: Conjunctivae are normal.  Neck: Normal range of motion.  Cardiovascular: Normal rate, regular rhythm, normal heart sounds and intact distal pulses.   No murmur heard. Capillary refill is a 3 seconds  Pulmonary/Chest: Effort normal and breath sounds normal.  Musculoskeletal: She exhibits tenderness. She exhibits no edema.  ROM: Slightly decreased range of  motion to the left knee secondary to pain Small joint effusion noted to the left knee; mild lateral joint line tenderness  Neurological: She is alert. Coordination normal.  Sensation intact to dull and sharp in the bilateral lower extremities Strength 5 out of 5 in the right lower leg and 3/5 in the left lower leg secondary to pain, strong and equal dorsiflexion and plantarflexion patient ambulates with antalgic gait but is able to weight-bear  Skin: Skin is warm and dry. No rash noted. She is not diaphoretic. No erythema.  No tenting of the skin No erythema or increased warmth of either knee, no induration of the skin  Psychiatric: She has a normal mood and affect.    ED Course  Procedures (including critical care time) Labs Review Labs Reviewed - No data to display Imaging Review Dg Knee Complete 4 Views Left  10/06/2013   CLINICAL DATA:  Left knee pain and swelling for 4 days.  EXAM: LEFT KNEE - COMPLETE 4+ VIEW  COMPARISON:  None.  FINDINGS: There is no acute bony or joint abnormality. Small joint effusion is noted. There is degenerative change about the knee which appears mild to moderate and most notable in the patellofemoral compartment.  IMPRESSION: No acute finding.  Mild to moderate degenerative change most notable in the patellofemoral compartment. Small joint effusion is noted.   Electronically Signed   By: Inge Rise M.D.   On: 10/06/2013 21:06    EKG Interpretation   None       MDM   1. Arthritis of left knee      Jamie Montes presents with gradually worsening left knee pain. 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  Patient with mild knee effusion on exam. Will image.  9:24 PM Patient X-Ray negative for obvious fracture or dislocation. I personally reviewed the imaging tests through PACS system.  I reviewed available ER/hospitalization records through the EMR.  X-ray  does show mild to moderate degenerative change most notable in the patellofemoral compartment, which were the cause of patient's symptoms.  Pain managed in ED. Pt advised to follow up with orthopedics if symptoms persist for further evaluation and discussion of treatment options. Conservative therapy recommended and discussed. Patient will be dc home & is agreeable with above plan.  It has been determined that no acute conditions requiring further emergency intervention are present at this time. The patient/guardian have been advised of the diagnosis and plan. We have discussed signs and symptoms that warrant return to the ED, such as changes or worsening in symptoms.   Vital signs are stable at discharge.   BP 158/70  Pulse 85  Temp(Src) 98.5 F (36.9 C) (Oral)  Resp 16  SpO2 95%  LMP 08/25/2011  Patient/guardian has voiced understanding and agreed to follow-up with the PCP or specialist.    I personally performed the services described in this documentation, which was scribed in my presence. The recorded information has been reviewed and is accurate.      Jarrett Soho Harrietta Incorvaia, PA-C 10/06/13 2125

## 2013-10-06 NOTE — ED Notes (Signed)
Patient transported to CT 

## 2014-01-08 IMAGING — CR DG CHEST 2V
2 series · 2 of 2 positions shown · non-contrast
Comparison: Chest x-ray of 06/13/2010

CLINICAL DATA: Preop for craniectomy and cervical laminectomy

CHEST - 2 VIEW

[view not recorded (1 of 2)]
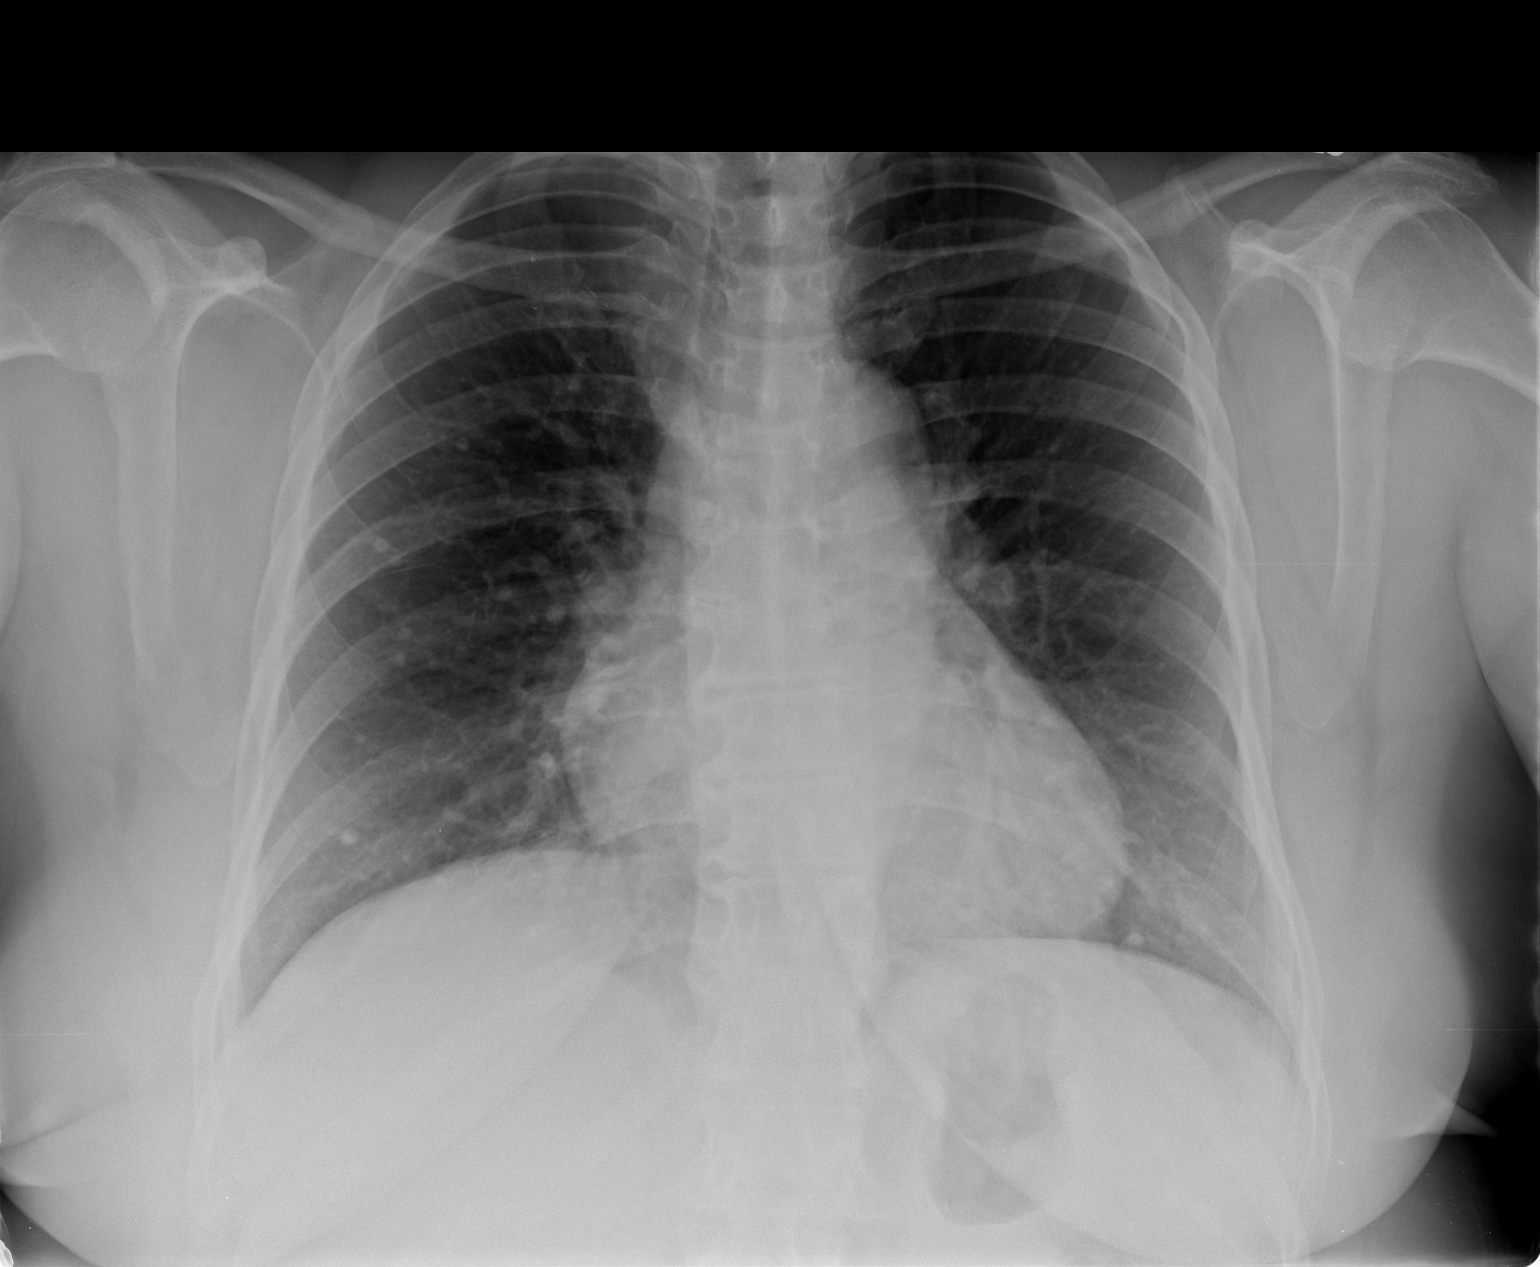

[view not recorded (2 of 2)]
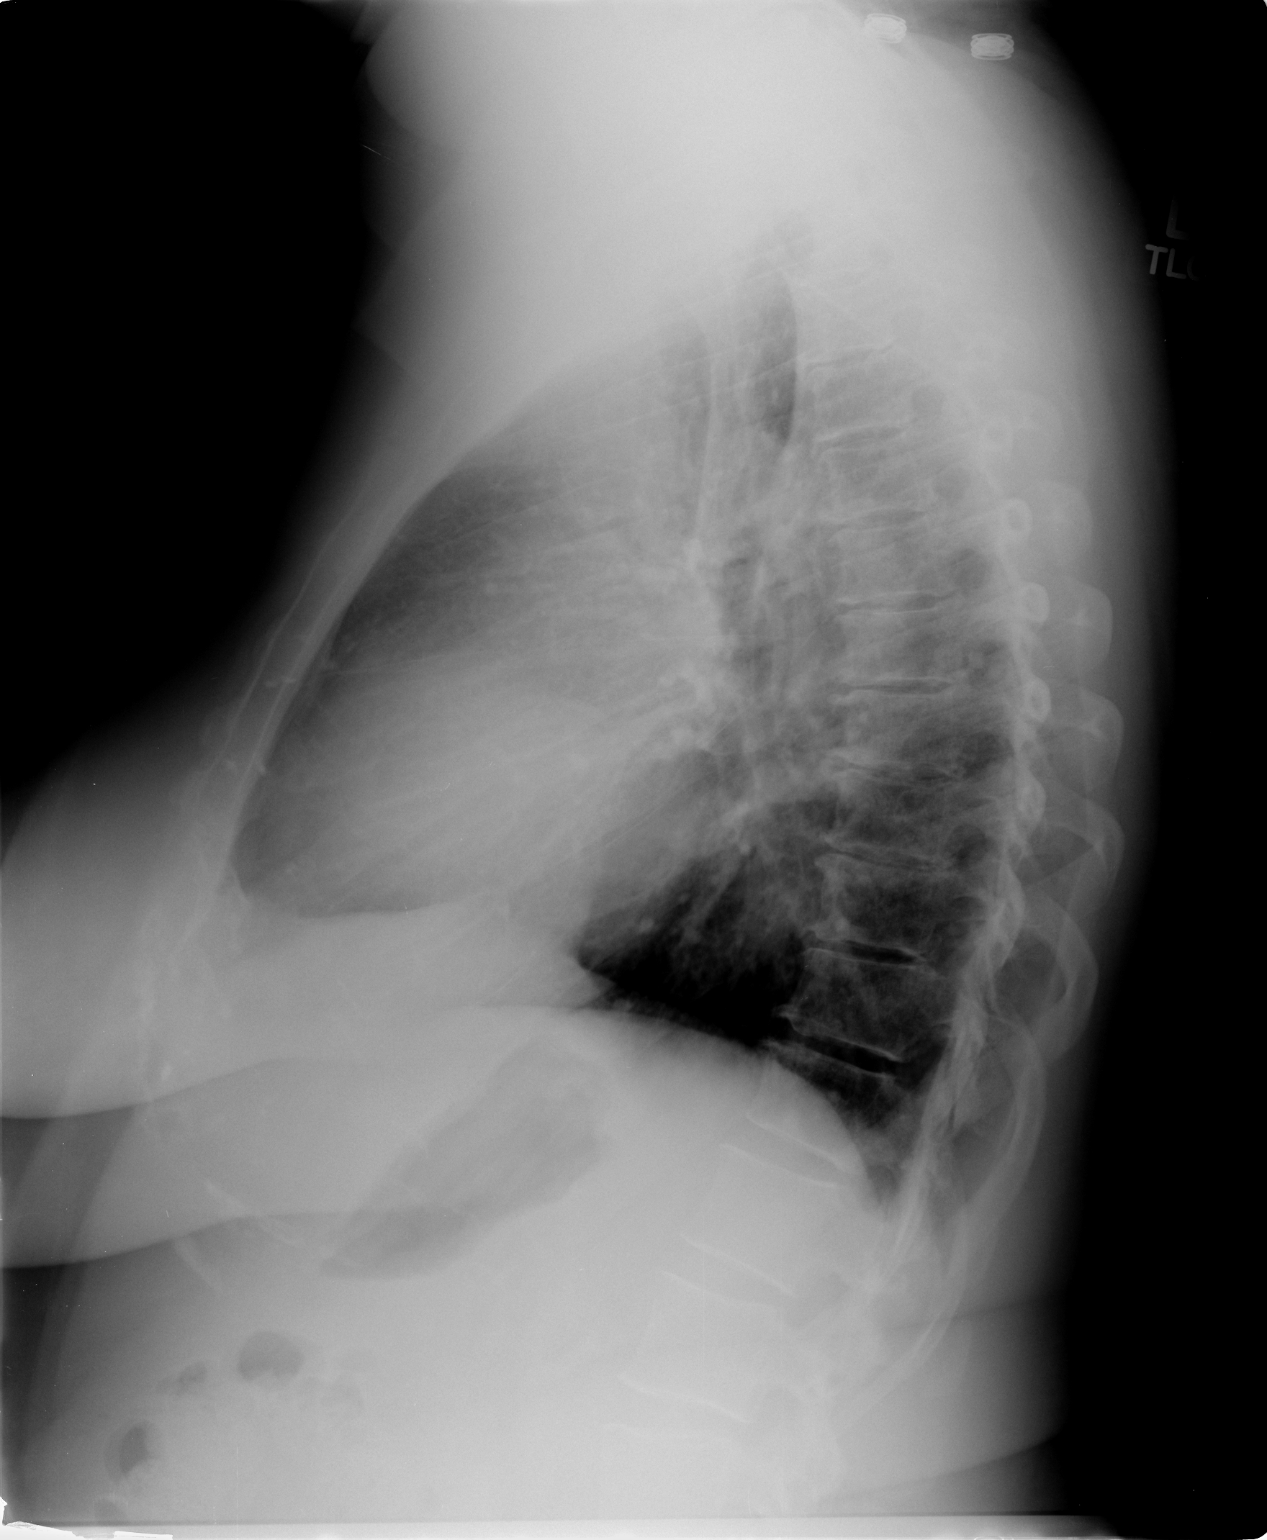

[2 of 2 positions shown; findings below may reference images not displayed]

FINDINGS: The lungs are clear.  Mediastinal contours are stable.
Mild cardiomegaly is stable.  There are mild degenerative changes
in the thoracic spine. Probable opaque linear artifacts overlie the
right upper lung field.
IMPRESSION: Stable mild cardiomegaly.  No active lung disease.

## 2014-04-17 ENCOUNTER — Encounter: Payer: Self-pay | Admitting: Gynecology

## 2014-04-17 ENCOUNTER — Ambulatory Visit (INDEPENDENT_AMBULATORY_CARE_PROVIDER_SITE_OTHER): Payer: Managed Care, Other (non HMO) | Admitting: Gynecology

## 2014-04-17 VITALS — BP 134/80

## 2014-04-17 DIAGNOSIS — N9089 Other specified noninflammatory disorders of vulva and perineum: Secondary | ICD-10-CM

## 2014-04-17 NOTE — Progress Notes (Signed)
   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 On the office visit of October 2013 she was noted to have 2 perirectal condylomas which were excised with the LEEP wire loop as well as the left medial thigh. Pathology report confirmed condyloma acuminatum. Patient's Pap smear October 2014 was benign.   Exam today: Physical Exam  Genitourinary:      patient was counseled for the above biopsy sites noted. One percent lidocaine was applied to the base and with the use of the key punch biopsy instrument respective samples were obtained and marked accordingly as described above in the picture. Silver nitrate was used for hemostasis.  Assessment/plan: Patient went to polypoid-like lesion one of the mons pubis and one right medial thigh appears to be benign. Additional condylomatous-like lesions were noted as described above although the one on the superior portion of the left labia majora was hyperpigmented in which melanoma needs to be ruled out. We will wait for pathology report. We had discussed about treating all the above with CO2 laser in an outpatient setting. Visual return back next week for postop consultation exam and further discussion. All the above was discussed in [place].

## 2014-04-17 NOTE — Patient Instructions (Signed)
Verrugas genitales (Genital Warts)  Las verrugas genitales son una enfermedad de transmisin sexual. Pueden aparecer como pequeos bultos en los tejidos de la zona genital.  CAUSAS Las verrugas genitales ests ocasionadas por un virus denominado VPH (virus del papiloma humano) Es la ms frecuente entre las enfermedades de transmisin sexual e infeccin de los rganos sexuales. Esta infeccin se disemina por mantener relaciones sexuales sin proteccin con una persona infectada. Puede transmitirse practicando sexo vaginal, anal u oral. Muchas personas no saben que estn infectadas. Pueden padecer la infeccin durante aos, y manifestarse pocos o ningn sntoma (problema) Tambin pueden transmitir la infeccin a sus parejas sin saberlo. SNTOMAS  Picazn e irritacin de la zona genital.  Verrugas que sangran.  Relaciones sexuales dolorosas. DIAGNSTICO Las verrugas se reconocen a simple vista en la vagina, en la vulva, el perineo, el ano y el recto. Algunas pruebas pueden diagnosticar las verrugas genitales:  Papanicolau.  Examen de una muestra de tejido biopsia.  Colposcopa Se trata de un instrumento que aumenta el tamao de lo que se observa al examinar la vagina y el cuello uterino. Al aplicar ciertas soluciones, las clulas de VPH cambian de color. TRATAMIENTO Las verrugas pueden eliminarse del siguiente modo:   Aplicando sustancias qumicas como cantaridina o podofilina.  Nitrgeno lquido congelante (crioterapia).  Inmunoterapia con inyecciones de cndida o tricofiton.  Tratamiento con lser.  Quemarlas a travs de una sonda con electricidad (electrocauterizacin).  Inyecciones de interfern.  Ciruga. PREVENCIN La vacuna contra el VPH puede ayudar a prevenir la infeccin por VPH que causa verrugas genitales y cncer del cuello uterino. Se recomienda administrar la vacuna a personas de entre 9 y 26 aos de edad. La vacuna puede no funcionar correctamente o no hacer efecto en  absoluto si usted ya tiene VPH. Estas vacunas no deben administrarse a mujeres embarazadas. INSTRUCCIONES PARA EL CUIDADO DOMICILIARIO  Es importante seguir las indicaciones del profesional que la asiste. Las verrugas no desaparecern sin tratamiento. Generalmente es necesario repetir el tratamiento para eliminarlas. En general, an despus de que las pruebas fsicas de las verrugas hayan desaparecido, el tejido normal subyacente sigue infectado.  No intente tratar las verrugas genitales con el mismo medicamento que se utiliza para las verrugas de las manos. Este tipo de medicamento es muy fuerte y puede quemar la piel de la zona genital, ocasionando ms daos.  Infrmele a sus parejas sexuales con las que haya mantenido relaciones antes del tratamiento, que usted sufre de verrugas genitales. Podran tambin estar infectados y necesitar tratamiento.  Evite el contacto sexual mientras realiza el tratamiento.  No frote o rasque las verrugas. Esta es una infeccin viral y puede diseminarse a otras partes del organismo (auto inoculacin).  Las mujeres que sufren de verrugas genitales debern hacer un control para prevenir el cncer cervical (Papanicolau) al menos una vez al ao. Este tipo de cncer es de crecimiento lento y puede curarse si se detecta en una etapa temprana. Las posibilidades de desarrollar cncer cervical aumentan en las mujeres que han sufrido HPV.  Informe a su obstetra en caso de embarazo. Este virus puede transmitirse al tracto respiratorio del beb. Convrselo con el profesional que lo asiste.  Siempre use condones en sus relaciones sexuales. Luego del tratamiento, use preservativos para prevenir la reinfeccin.  Para la picazn, puede usar medicamentos de venta libre; consulte con su mdico antes de utilizarlos. SOLICITE ATENCIN MDICA SI:  La piel de la zona tratada se vuelve roja, se hincha o duele.  Tiene fiebre.  Siente   un malestar generalizado.  Siente pequeos  bultos (como granos) alrededor de la zona genital.  Tiene dolor o hemorragias durante las relaciones sexuales. EST SEGURO QUE:   Comprende las instrucciones para el alta mdica.  Controlar su enfermedad.  Solicitar atencin mdica de inmediato segn las indicaciones. Document Released: 06/18/2005 Document Revised: 12/01/2011 [employer] Patient Information 2015 [employer]. This information is not intended to replace advice given to you by your health care provider. Make sure you discuss any questions you have with your health care provider.  

## 2014-04-17 NOTE — Addendum Note (Signed)
Addended by: Thurnell Garbe A on: 04/17/2014 09:12 AM   Modules accepted: Orders

## 2014-04-24 ENCOUNTER — Encounter: Payer: Self-pay | Admitting: Gynecology

## 2014-04-24 ENCOUNTER — Ambulatory Visit (INDEPENDENT_AMBULATORY_CARE_PROVIDER_SITE_OTHER): Payer: Managed Care, Other (non HMO) | Admitting: Gynecology

## 2014-04-24 VITALS — BP 142/82 | Ht 62.0 in | Wt 228.0 lb

## 2014-04-24 DIAGNOSIS — N9089 Other specified noninflammatory disorders of vulva and perineum: Secondary | ICD-10-CM

## 2014-04-24 DIAGNOSIS — A630 Anogenital (venereal) warts: Secondary | ICD-10-CM

## 2014-04-24 DIAGNOSIS — Z01818 Encounter for other preprocedural examination: Secondary | ICD-10-CM

## 2014-04-24 MED ORDER — SILVER SULFADIAZINE 1 % EX CREA: 1.0000 "application " | TOPICAL_CREAM | Freq: Every day | CUTANEOUS | Status: DC

## 2014-04-24 MED ORDER — OXYCODONE-ACETAMINOPHEN 5-325 MG PO TABS: 1.0000 | ORAL_TABLET | Freq: Four times a day (QID) | ORAL | Status: DC | PRN

## 2014-04-24 MED ORDER — IBUPROFEN 800 MG PO TABS: 800.0000 mg | ORAL_TABLET | Freq: Three times a day (TID) | ORAL | Status: DC | PRN

## 2014-04-24 MED ORDER — METOCLOPRAMIDE HCL 10 MG PO TABS: 10.0000 mg | ORAL_TABLET | Freq: Three times a day (TID) | ORAL | Status: DC

## 2014-04-24 NOTE — Progress Notes (Signed)
Jamie Montes is an 58 y.o. female. For preop consultation and exam. 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 On the office visit of October 2013 she was noted to have 2 perirectal condylomas which were excised with the LEEP wire loop as well as the left medial thigh. Pathology report confirmed condyloma acuminatum. Patient's Pap smear October 2014 was benign. Please see pictures from office visit July 27 outlining the different areas were lesions were noted. The pathology report is as follows:   Diagnosis 1. Vulva, biopsy, left labia majora BENIGN EPIDERMAL HYPERPLASIA, SEE DESCRIPTION 2. Vulva, biopsy, right labia majora BENIGN EPIDERMAL HYPERPLASIA, SEE DESCRIPTION 3. Perineum, biopsy, left side BENIGN EPIDERMAL HYPERPLASIA, SEE DESCRIPTION Microscopic Comment 1. -3. There is benign papillary epidermal hyperplasia with hyperkeratosis. There is a lymphocytic infiltrate. No diagnostic viral changes are observed. Considerations in this setting are reactive epidermal hyperplasia such as lichen simplex chronicus or evolving prurigo nodule, seborrheic keratosis, or non diagnostic condyloma. PAS stain is negative for fungal organism. There is no evidence of VIN or malignancy.  Patient scheduled for CO2 laser ablation of these condylomatous growth as well as removal of skin tag on mons pubis area.  Pertinent Gynecological History: Menses: post-menopausal Bleeding: menopause no bleeding Contraception: post menopausal status DES exposure: denies Blood transfusions: npo Sexually transmitted diseases: condyloma Previous GYN Procedures: LEEP CONE  Last mammogram: normal Date: 2015 Last pap: normal Date: 2015 OB History:  G3, P2A1   Menstrual History: Menarche age: 67  Patient's last menstrual period was 08/25/2011.    Past Medical History  Diagnosis Date  . NSVD (normal spontaneous vaginal delivery)     X2  . Condyloma     VULVAR / PERINEAL  . Vitamin D deficiency   . Hypertension   . History of colonic polyps   . Anemia   . Arthritis     Past Surgical History  Procedure Laterality Date  . 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 Dilation and curettage of uterus      IST TRIMESTER SAB  . Resectoscopic polypectomy    . Lipoma excision      LEFT ARM/shoulder  . Suboccipital craniectomy cervical laminectomy  04/12/2012    Procedure: SUBOCCIPITAL CRANIECTOMY CERVICAL LAMINECTOMY/DURAPLASTY;  Surgeon: Hosie Spangle, MD;  Location: [place] [facility];  Service: Neurosurgery;  Laterality: N/A;  suboccipital craniectomy with upper cervical laminectomy with duroplasty    Family History  Problem Relation Age of Onset  . Hypertension Mother   . Diabetes Mother   . Cancer Maternal Aunt 74    COLON    Social History:  reports that she has never smoked. She has never used smokeless tobacco. She reports that she does not drink alcohol or use illicit drugs.  Allergies: No Known Allergies   (Not in a hospital admission)  REVIEW OF SYSTEMS: A ROS was performed and pertinent positives and negatives are included in the history.  GENERAL: No fevers or chills. HEENT: No change in vision, no earache, sore throat or sinus congestion. NECK: No pain or stiffness. CARDIOVASCULAR: No chest pain or pressure. No palpitations. PULMONARY: No shortness of breath, cough or wheeze. GASTROINTESTINAL: No abdominal pain, nausea,  vomiting or diarrhea, melena or bright red blood per rectum. GENITOURINARY: No urinary frequency, urgency, hesitancy or dysuria. MUSCULOSKELETAL: No joint or muscle pain, no back pain, no recent trauma. DERMATOLOGIC: No rash, no itching, no lesions. ENDOCRINE: No  polyuria, polydipsia, no heat or cold intolerance. No recent change in weight. HEMATOLOGICAL: No anemia or easy bruising or bleeding. NEUROLOGIC: No headache, seizures, numbness, tingling or weakness. PSYCHIATRIC: No depression, no loss of interest in normal activity or change in sleep pattern.     Blood pressure 142/82, height 5\' 2"  (1.575 m), weight 228 lb (103.42 kg), last menstrual period 08/25/2011.  Physical Exam:  HEENT:unremarkable Neck:Supple, midline, no thyroid megaly, no carotid bruits Lungs:  Clear to auscultation no rhonchi's or wheezes Heart:Regular rate and rhythm, no murmurs or gallops Breast Exam: Symmetrical in appearance no palpable masses or tenderness no supraclavicular axillary lymphadenopathy at time of her annual exam October 2014. Abdomen: Soft nontender no rebound guarding Pelvic:BUS as described above Vagina: No lesions or discharge Cervix: No lesions or discharge Uterus: Anteverted normal size shape and consistency Adnexa: No palpable mass or tenderness Extremities: No cords, no edema Rectal: Not examined   Assessment/Plan: Scheduled for CO2 laser ablation of condylomatous growth through different areas her external genitalia. See pathology report above. We'll also excise mons pubis skin tag. Patient was given prescription of Silvadene cream to apply each bedtime which she is discharged home. I've also provided her a prescription for Percocet to take 1 by mouth every 4-6 hours when necessary pain postop. She was also given a prescription for Reglan 10 mg to take one by mouth every 4-6 hours when necessary nausea postop. The following risks were discussed.                        Patient was counseled as to the risk of surgery to include the following:  1. Infection (prohylactic antibiotics will be administered)  2. DVT/Pulmonary Embolism (prophylactic pneumo compression stockings will be used)  3.Trauma to internal organs requiring additional surgical  procedure to repair any injury to     Internal organs requiring perhaps additional hospitalization days.  4.Hemmorhage requiring transfusion and blood products which carry risks such as  anaphylactic reaction, hepatitis and AIDS  Patient had received literature information on the procedure scheduled and all her questions were answered and fully accepts all risk.   [facility] HMD9:20 AMTD@Note : This dictation was prepared with  Dragon/digital dictation along withSmart phrase technology. Any transcriptional errors that result from this process are unintentional.      Terrance Mass 04/24/2014, 8:26 AM  Note: This dictation was prepared with  Dragon/digital dictation along withSmart phrase technology. Any transcriptional errors that result from this process are unintentional.

## 2014-04-25 ENCOUNTER — Encounter (HOSPITAL_COMMUNITY): Payer: Self-pay | Admitting: Pharmacist

## 2014-05-01 ENCOUNTER — Encounter (HOSPITAL_COMMUNITY): Payer: Self-pay

## 2014-05-01 ENCOUNTER — Encounter (HOSPITAL_COMMUNITY)
Admission: RE | Admit: 2014-05-01 | Discharge: 2014-05-01 | Disposition: A | Discharge status: Home / Self Care | Payer: Managed Care, Other (non HMO) | Source: Ambulatory Visit | Attending: Gynecology | Admitting: Gynecology

## 2014-05-01 DIAGNOSIS — Z01812 Encounter for preprocedural laboratory examination: Secondary | ICD-10-CM | POA: Insufficient documentation

## 2014-05-01 DIAGNOSIS — Z01810 Encounter for preprocedural cardiovascular examination: Secondary | ICD-10-CM | POA: Insufficient documentation

## 2014-05-01 DIAGNOSIS — Z01818 Encounter for other preprocedural examination: Secondary | ICD-10-CM | POA: Insufficient documentation

## 2014-05-01 LAB — BASIC METABOLIC PANEL
Anion gap: 11 (ref 5–15)
BUN: 14 mg/dL (ref 6–23)
CO2: 23 mEq/L (ref 19–32)
Calcium: 9.2 mg/dL (ref 8.4–10.5)
Chloride: 106 mEq/L (ref 96–112)
Creatinine, Ser: 0.74 mg/dL (ref 0.50–1.10)
GFR calc Af Amer: >90 mL/min (ref >90)
GLUCOSE: 115 mg/dL — AB (ref 70–99)
Potassium: 3.8 mEq/L (ref 3.7–5.3)
SODIUM: 140 meq/L (ref 137–147)

## 2014-05-01 LAB — CBC
HCT: 38.4 % (ref 36.0–46.0)
HEMOGLOBIN: 12.9 g/dL (ref 12.0–15.0)
MCH: 29.5 pg (ref 26.0–34.0)
MCHC: 33.6 g/dL (ref 30.0–36.0)
MCV: 87.7 fL (ref 78.0–100.0)
PLATELETS: 213 10*3/uL (ref 150–400)
RBC: 4.38 MIL/uL (ref 3.87–5.11)
RDW: 13.7 % (ref 11.5–15.5)
WBC: 7.8 10*3/uL (ref 4.0–10.5)

## 2014-05-01 NOTE — Patient Instructions (Signed)
   Your procedure is scheduled on: May 05 2014  Enter through the Main Entrance of [facility] up the phone at the desk and dial [phone] and inform us of your arrival.  Please call this number if you have any problems the morning of surgery: [phone]  Remember: Do not eat food after midnight:May 04 2014 Do not drink clear liquids after:9AM Take these medicines the morning of surgery with a SIP OF WATER: METOPROLOL  DAY OF SURGERY  Do not wear jewelry, make-up, or FINGER nail polish No metal in your hair or on your body. Do not wear lotions, powders, perfumes.  You may wear deodorant.  Do not bring valuables to the hospital. Contacts, dentures or bridgework may not be worn into surgery.  Leave suitcase in the car. After Surgery it may be brought to your room. For patients being admitted to the hospital, checkout time is 11:00am the day of discharge.    Patients discharged on the day of surgery will not be allowed to drive home.

## 2014-05-01 NOTE — Pre-Procedure Instructions (Signed)
Dr. Royce Macadamia ok with ekg for surgery

## 2014-05-01 NOTE — Progress Notes (Signed)
Pre-op visit done with eda royal, spanish interpretor.

## 2014-05-03 ENCOUNTER — Telehealth: Payer: Self-pay

## 2014-05-03 NOTE — Telephone Encounter (Signed)
I had a cancellation for 7:30am on 8/14 and patient is scheduled at [place] spoke with her and she is fine to move to 7:30am but had concerns regarding taking her BP meds and the instructions that [facility] nurse gave her and how that will be affected by time change. I called [facility] and spoke with Arrie Aran, Therapist, sports.  She had actually done patient's preop work up. She will call patient's daughter, at patient's request, and give her new instructions for the new time. (Daughter Jamie Montes [phone])

## 2014-05-03 NOTE — Progress Notes (Signed)
Spoke to [employer] (daughter) and informed her of time change and gave her instructions.

## 2014-05-05 ENCOUNTER — Encounter (HOSPITAL_COMMUNITY)
Admission: RE | Disposition: A | Discharge status: Home / Self Care | Payer: Self-pay | Source: Ambulatory Visit | Attending: Gynecology

## 2014-05-05 ENCOUNTER — Ambulatory Visit (HOSPITAL_COMMUNITY): Payer: Managed Care, Other (non HMO) | Admitting: Anesthesiology

## 2014-05-05 ENCOUNTER — Ambulatory Visit (HOSPITAL_COMMUNITY)
Admission: RE | Admit: 2014-05-05 | Discharge: 2014-05-05 | Disposition: A | Discharge status: Home / Self Care | Payer: Managed Care, Other (non HMO) | Source: Ambulatory Visit | Attending: Gynecology | Admitting: Gynecology

## 2014-05-05 ENCOUNTER — Encounter (HOSPITAL_COMMUNITY): Payer: Managed Care, Other (non HMO) | Admitting: Anesthesiology

## 2014-05-05 DIAGNOSIS — D280 Benign neoplasm of vulva: Secondary | ICD-10-CM | POA: Diagnosis not present

## 2014-05-05 DIAGNOSIS — Z8249 Family history of ischemic heart disease and other diseases of the circulatory system: Secondary | ICD-10-CM | POA: Insufficient documentation

## 2014-05-05 DIAGNOSIS — Z860100 Personal history of colon polyps, unspecified: Secondary | ICD-10-CM | POA: Insufficient documentation

## 2014-05-05 DIAGNOSIS — A630 Anogenital (venereal) warts: Secondary | ICD-10-CM

## 2014-05-05 DIAGNOSIS — Z800 Family history of malignant neoplasm of digestive organs: Secondary | ICD-10-CM | POA: Insufficient documentation

## 2014-05-05 DIAGNOSIS — E559 Vitamin D deficiency, unspecified: Secondary | ICD-10-CM | POA: Diagnosis not present

## 2014-05-05 DIAGNOSIS — Z9889 Other specified postprocedural states: Secondary | ICD-10-CM

## 2014-05-05 DIAGNOSIS — N906 Unspecified hypertrophy of vulva: Secondary | ICD-10-CM

## 2014-05-05 DIAGNOSIS — L851 Acquired keratosis [keratoderma] palmaris et plantaris: Secondary | ICD-10-CM | POA: Insufficient documentation

## 2014-05-05 DIAGNOSIS — Z6841 Body Mass Index (BMI) 40.0 and over, adult: Secondary | ICD-10-CM | POA: Diagnosis not present

## 2014-05-05 DIAGNOSIS — D649 Anemia, unspecified: Secondary | ICD-10-CM | POA: Diagnosis not present

## 2014-05-05 DIAGNOSIS — Z87891 Personal history of nicotine dependence: Secondary | ICD-10-CM | POA: Diagnosis not present

## 2014-05-05 DIAGNOSIS — L909 Atrophic disorder of skin, unspecified: Secondary | ICD-10-CM

## 2014-05-05 DIAGNOSIS — M129 Arthropathy, unspecified: Secondary | ICD-10-CM | POA: Diagnosis not present

## 2014-05-05 DIAGNOSIS — I10 Essential (primary) hypertension: Secondary | ICD-10-CM | POA: Insufficient documentation

## 2014-05-05 DIAGNOSIS — Z833 Family history of diabetes mellitus: Secondary | ICD-10-CM | POA: Diagnosis not present

## 2014-05-05 DIAGNOSIS — L919 Hypertrophic disorder of the skin, unspecified: Secondary | ICD-10-CM

## 2014-05-05 HISTORY — PX: LESION REMOVAL: SHX5196

## 2014-05-05 HISTORY — PX: CO2 LASER APPLICATION: SHX5778

## 2014-05-05 SURGERY — CO2 LASER APPLICATION
Anesthesia: General | Site: Vagina

## 2014-05-05 MED ORDER — FENTANYL CITRATE 0.05 MG/ML IJ SOLN
INTRAMUSCULAR | Status: DC | PRN
  Administered 2014-05-05: 100 ug via INTRAVENOUS
  Administered 2014-05-05: 50 ug via INTRAVENOUS

## 2014-05-05 MED ORDER — ACETIC ACID 5 % SOLN
Status: DC | PRN
  Administered 2014-05-05: 1 via TOPICAL

## 2014-05-05 MED ORDER — SILVER SULFADIAZINE 1 % EX CREA
TOPICAL_CREAM | CUTANEOUS | Status: DC | PRN
  Administered 2014-05-05: 1 via TOPICAL

## 2014-05-05 MED ORDER — ACETIC ACID 5 % SOLN
Status: AC
  Filled 2014-05-05: qty 500

## 2014-05-05 MED ORDER — SCOPOLAMINE 1 MG/3DAYS TD PT72
1.0000 | MEDICATED_PATCH | Freq: Once | TRANSDERMAL | Status: DC
  Administered 2014-05-05: 1.5 mg via TRANSDERMAL

## 2014-05-05 MED ORDER — MIDAZOLAM HCL 2 MG/2ML IJ SOLN
INTRAMUSCULAR | Status: AC
  Filled 2014-05-05: qty 2

## 2014-05-05 MED ORDER — MIDAZOLAM HCL 2 MG/2ML IJ SOLN: 0.5000 mg | Freq: Once | INTRAMUSCULAR | Status: DC | PRN

## 2014-05-05 MED ORDER — PROMETHAZINE HCL 25 MG/ML IJ SOLN: 6.2500 mg | INTRAMUSCULAR | Status: DC | PRN

## 2014-05-05 MED ORDER — SCOPOLAMINE 1 MG/3DAYS TD PT72
MEDICATED_PATCH | TRANSDERMAL | Status: AC
  Filled 2014-05-05: qty 1

## 2014-05-05 MED ORDER — KETOROLAC TROMETHAMINE 30 MG/ML IJ SOLN
INTRAMUSCULAR | Status: AC
  Filled 2014-05-05: qty 1

## 2014-05-05 MED ORDER — LACTATED RINGERS IV SOLN
INTRAVENOUS | Status: DC
  Administered 2014-05-05 (×3): via INTRAVENOUS

## 2014-05-05 MED ORDER — MIDAZOLAM HCL 2 MG/2ML IJ SOLN
INTRAMUSCULAR | Status: DC | PRN
  Administered 2014-05-05: 2 mg via INTRAVENOUS

## 2014-05-05 MED ORDER — LIDOCAINE HCL (CARDIAC) 20 MG/ML IV SOLN
INTRAVENOUS | Status: AC
  Filled 2014-05-05: qty 5

## 2014-05-05 MED ORDER — ONDANSETRON HCL 4 MG/2ML IJ SOLN
INTRAMUSCULAR | Status: DC | PRN
  Administered 2014-05-05: 4 mg via INTRAVENOUS

## 2014-05-05 MED ORDER — SILVER SULFADIAZINE 1 % EX CREA
TOPICAL_CREAM | CUTANEOUS | Status: AC
  Filled 2014-05-05: qty 50

## 2014-05-05 MED ORDER — SUCCINYLCHOLINE CHLORIDE 20 MG/ML IJ SOLN
INTRAMUSCULAR | Status: AC
  Filled 2014-05-05: qty 10

## 2014-05-05 MED ORDER — PROPOFOL 10 MG/ML IV BOLUS
INTRAVENOUS | Status: DC | PRN
  Administered 2014-05-05: 200 mg via INTRAVENOUS

## 2014-05-05 MED ORDER — PROPOFOL 10 MG/ML IV EMUL
INTRAVENOUS | Status: AC
  Filled 2014-05-05: qty 20

## 2014-05-05 MED ORDER — KETOROLAC TROMETHAMINE 30 MG/ML IJ SOLN
INTRAMUSCULAR | Status: DC | PRN
  Administered 2014-05-05: 30 mg via INTRAVENOUS

## 2014-05-05 MED ORDER — MEPERIDINE HCL 25 MG/ML IJ SOLN: 6.2500 mg | INTRAMUSCULAR | Status: DC | PRN

## 2014-05-05 MED ORDER — LIDOCAINE HCL 1 % IJ SOLN
INTRAMUSCULAR | Status: DC | PRN
  Administered 2014-05-05: 10 mL
  Administered 2014-05-05: 5 mL

## 2014-05-05 MED ORDER — GLYCOPYRROLATE 0.2 MG/ML IJ SOLN
INTRAMUSCULAR | Status: DC | PRN
  Administered 2014-05-05: 0.1 mg via INTRAVENOUS

## 2014-05-05 MED ORDER — EPHEDRINE SULFATE 50 MG/ML IJ SOLN
INTRAMUSCULAR | Status: DC | PRN
  Administered 2014-05-05: 10 mg via INTRAVENOUS

## 2014-05-05 MED ORDER — LIDOCAINE HCL (CARDIAC) 20 MG/ML IV SOLN
INTRAVENOUS | Status: DC | PRN
Start: 2014-05-05 — End: 2014-05-05
  Administered 2014-05-05: 100 mg via INTRAVENOUS

## 2014-05-05 MED ORDER — KETOROLAC TROMETHAMINE 30 MG/ML IJ SOLN: 15.0000 mg | Freq: Once | INTRAMUSCULAR | Status: DC | PRN

## 2014-05-05 MED ORDER — FENTANYL CITRATE 0.05 MG/ML IJ SOLN
INTRAMUSCULAR | Status: AC
  Filled 2014-05-05: qty 5

## 2014-05-05 MED ORDER — FERRIC SUBSULFATE 259 MG/GM EX SOLN
CUTANEOUS | Status: AC
  Filled 2014-05-05: qty 8

## 2014-05-05 MED ORDER — DEXAMETHASONE SODIUM PHOSPHATE 10 MG/ML IJ SOLN
INTRAMUSCULAR | Status: DC | PRN
Start: 2014-05-05 — End: 2014-05-05
  Administered 2014-05-05: 10 mg via INTRAVENOUS

## 2014-05-05 MED ORDER — ONDANSETRON HCL 4 MG/2ML IJ SOLN
INTRAMUSCULAR | Status: AC
  Filled 2014-05-05: qty 2

## 2014-05-05 MED ORDER — DEXAMETHASONE SODIUM PHOSPHATE 10 MG/ML IJ SOLN
INTRAMUSCULAR | Status: AC
  Filled 2014-05-05: qty 1

## 2014-05-05 MED ORDER — FENTANYL CITRATE 0.05 MG/ML IJ SOLN
INTRAMUSCULAR | Status: AC
  Filled 2014-05-05: qty 2

## 2014-05-05 MED ORDER — IODINE STRONG (LUGOLS) 5 % PO SOLN
ORAL | Status: AC
  Filled 2014-05-05: qty 1

## 2014-05-05 MED ORDER — EPHEDRINE 5 MG/ML INJ
INTRAVENOUS | Status: AC
  Filled 2014-05-05: qty 10

## 2014-05-05 MED ORDER — LIDOCAINE HCL 1 % IJ SOLN
INTRAMUSCULAR | Status: AC
  Filled 2014-05-05: qty 20

## 2014-05-05 MED ORDER — GLYCOPYRROLATE 0.2 MG/ML IJ SOLN
INTRAMUSCULAR | Status: AC
  Filled 2014-05-05: qty 1

## 2014-05-05 MED ORDER — FENTANYL CITRATE 0.05 MG/ML IJ SOLN
25.0000 ug | INTRAMUSCULAR | Status: DC | PRN
  Administered 2014-05-05: 50 ug via INTRAVENOUS

## 2014-05-05 SURGICAL SUPPLY — 28 items
APPLICATOR COTTON TIP 6IN STRL (MISCELLANEOUS) IMPLANT
CABLE HIGH FREQUENCY MONO STRZ (ELECTRODE) ×2 IMPLANT
CATH ROBINSON RED A/P 16FR (CATHETERS) IMPLANT
CLOTH BEACON ORANGE TIMEOUT ST (SAFETY) ×2 IMPLANT
CONTAINER PREFILL 10% NBF 15ML (MISCELLANEOUS) ×2 IMPLANT
CONTAINER PREFILL 10% NBF 60ML (FORM) IMPLANT
COUNTER NEEDLE 1200 MAGNETIC (NEEDLE) IMPLANT
DEPRESSOR TONGUE BLADE STERILE (MISCELLANEOUS) ×2 IMPLANT
EVACUATOR PREFILTER SMOKE (MISCELLANEOUS) ×2 IMPLANT
GLOVE BIOGEL PI IND STRL 8 (GLOVE) ×1 IMPLANT
GLOVE BIOGEL PI INDICATOR 8 (GLOVE) ×1
GLOVE ECLIPSE 7.5 STRL STRAW (GLOVE) ×4 IMPLANT
GOWN STRL REUS W/TWL LRG LVL3 (GOWN DISPOSABLE) ×4 IMPLANT
HOSE NS SMOKE EVAC 7/8 X6 (MISCELLANEOUS) ×2 IMPLANT
NS IRRIG 1000ML POUR BTL (IV SOLUTION) ×2 IMPLANT
PACK VAGINAL MINOR WOMEN LF (CUSTOM PROCEDURE TRAY) ×2 IMPLANT
PAD OB MATERNITY 4.3X12.25 (PERSONAL CARE ITEMS) ×2 IMPLANT
PAD PREP 24X48 CUFFED NSTRL (MISCELLANEOUS) ×2 IMPLANT
REDUCER FITTING SMOKE EVAC (MISCELLANEOUS) ×2 IMPLANT
SCOPETTES 8  STERILE (MISCELLANEOUS) ×1
SCOPETTES 8 STERILE (MISCELLANEOUS) ×1 IMPLANT
SUT MNCRL AB 3-0 PS2 18 (SUTURE) IMPLANT
SUT PLAIN 3 0 FS 2 27 (SUTURE) IMPLANT
SUT VIC AB 4-0 P-3 18XBRD (SUTURE) IMPLANT
SUT VIC AB 4-0 P3 18 (SUTURE)
SUT VICRYL 3 0 RAPIDE (SUTURE) ×2 IMPLANT
TOWEL OR 17X24 6PK STRL BLUE (TOWEL DISPOSABLE) ×4 IMPLANT
WATER STERILE IRR 1000ML POUR (IV SOLUTION) ×2 IMPLANT

## 2014-05-05 NOTE — OR Nursing (Signed)
Discharge instructions reviewed with patient,patients daughter who speaks fluent english and hospital interpretor Maydat. Vivien Rota Kullen Tomasetti,rn

## 2014-05-05 NOTE — Anesthesia Postprocedure Evaluation (Signed)
  Anesthesia Post-op Note  Patient: Jamie Montes  Procedure(s) Performed: Procedure(s): CO2 LASER VAPORIZATION OF VULVAR CONDYLOMA (N/A) EXCISION OF VULVAR/PERINEAL LESIONS (N/A)  Patient Location: PACU  Anesthesia Type:General  Level of Consciousness: awake, alert  and oriented  Airway and Oxygen Therapy: Patient Spontanous Breathing  Post-op Pain: none  Post-op Assessment: Post-op Vital signs reviewed, Patient's Cardiovascular Status Stable, Respiratory Function Stable, Patent Airway, No signs of Nausea or vomiting and Pain level controlled  Post-op Vital Signs: Reviewed and stable  Last Vitals:  Filed Vitals:   05/05/14 0930  BP:   Pulse:   Temp: 36.4 C  Resp:     Complications: No apparent anesthesia complications

## 2014-05-05 NOTE — Discharge Instructions (Signed)

## 2014-05-05 NOTE — H&P (View-Only) (Signed)
Jamie Montes is an 58 y.o. female. For preop consultation and exam. 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 On the office visit of October 2013 she was noted to have 2 perirectal condylomas which were excised with the LEEP wire loop as well as the left medial thigh. Pathology report confirmed condyloma acuminatum. Patient's Pap smear October 2014 was benign. Please see pictures from office visit July 27 outlining the different areas were lesions were noted. The pathology report is as follows:   Diagnosis 1. Vulva, biopsy, left labia majora BENIGN EPIDERMAL HYPERPLASIA, SEE DESCRIPTION 2. Vulva, biopsy, right labia majora BENIGN EPIDERMAL HYPERPLASIA, SEE DESCRIPTION 3. Perineum, biopsy, left side BENIGN EPIDERMAL HYPERPLASIA, SEE DESCRIPTION Microscopic Comment 1. -3. There is benign papillary epidermal hyperplasia with hyperkeratosis. There is a lymphocytic infiltrate. No diagnostic viral changes are observed. Considerations in this setting are reactive epidermal hyperplasia such as lichen simplex chronicus or evolving prurigo nodule, seborrheic keratosis, or non diagnostic condyloma. PAS stain is negative for fungal organism. There is no evidence of VIN or malignancy.  Patient scheduled for CO2 laser ablation of these condylomatous growth as well as removal of skin tag on mons pubis area.  Pertinent Gynecological History: Menses: post-menopausal Bleeding: menopause no bleeding Contraception: post menopausal status DES exposure: denies Blood transfusions: npo Sexually transmitted diseases: condyloma Previous GYN Procedures: LEEP CONE  Last mammogram: normal Date: 2015 Last pap: normal Date: 2015 OB History:  G3, P2A1   Menstrual History: Menarche age: 89  Patient's last menstrual period was 08/25/2011.    Past Medical History  Diagnosis Date  . NSVD (normal spontaneous vaginal delivery)     X2  . Condyloma     VULVAR / PERINEAL  . Vitamin D deficiency   . Hypertension   . History of colonic polyps   . Anemia   . Arthritis     Past Surgical History  Procedure Laterality Date  . 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 Dilation and curettage of uterus      IST TRIMESTER SAB  . Resectoscopic polypectomy    . Lipoma excision      LEFT ARM/shoulder  . Suboccipital craniectomy cervical laminectomy  04/12/2012    Procedure: SUBOCCIPITAL CRANIECTOMY CERVICAL LAMINECTOMY/DURAPLASTY;  Surgeon: Hosie Spangle, MD;  Location: [place] [facility];  Service: Neurosurgery;  Laterality: N/A;  suboccipital craniectomy with upper cervical laminectomy with duroplasty    Family History  Problem Relation Age of Onset  . Hypertension Mother   . Diabetes Mother   . Cancer Maternal Aunt 74    COLON    Social History:  reports that she has never smoked. She has never used smokeless tobacco. She reports that she does not drink alcohol or use illicit drugs.  Allergies: No Known Allergies   (Not in a hospital admission)  REVIEW OF SYSTEMS: A ROS was performed and pertinent positives and negatives are included in the history.  GENERAL: No fevers or chills. HEENT: No change in vision, no earache, sore throat or sinus congestion. NECK: No pain or stiffness. CARDIOVASCULAR: No chest pain or pressure. No palpitations. PULMONARY: No shortness of breath, cough or wheeze. GASTROINTESTINAL: No abdominal pain, nausea,  vomiting or diarrhea, melena or bright red blood per rectum. GENITOURINARY: No urinary frequency, urgency, hesitancy or dysuria. MUSCULOSKELETAL: No joint or muscle pain, no back pain, no recent trauma. DERMATOLOGIC: No rash, no itching, no lesions. ENDOCRINE: No  polyuria, polydipsia, no heat or cold intolerance. No recent change in weight. HEMATOLOGICAL: No anemia or easy bruising or bleeding. NEUROLOGIC: No headache, seizures, numbness, tingling or weakness. PSYCHIATRIC: No depression, no loss of interest in normal activity or change in sleep pattern.     Blood pressure 142/82, height 5\' 2"  (1.575 m), weight 228 lb (103.42 kg), last menstrual period 08/25/2011.  Physical Exam:  HEENT:unremarkable Neck:Supple, midline, no thyroid megaly, no carotid bruits Lungs:  Clear to auscultation no rhonchi's or wheezes Heart:Regular rate and rhythm, no murmurs or gallops Breast Exam: Symmetrical in appearance no palpable masses or tenderness no supraclavicular axillary lymphadenopathy at time of her annual exam October 2014. Abdomen: Soft nontender no rebound guarding Pelvic:BUS as described above Vagina: No lesions or discharge Cervix: No lesions or discharge Uterus: Anteverted normal size shape and consistency Adnexa: No palpable mass or tenderness Extremities: No cords, no edema Rectal: Not examined   Assessment/Plan: Scheduled for CO2 laser ablation of condylomatous growth through different areas her external genitalia. See pathology report above. We'll also excise mons pubis skin tag. Patient was given prescription of Silvadene cream to apply each bedtime which she is discharged home. I've also provided her a prescription for Percocet to take 1 by mouth every 4-6 hours when necessary pain postop. She was also given a prescription for Reglan 10 mg to take one by mouth every 4-6 hours when necessary nausea postop. The following risks were discussed.                        Patient was counseled as to the risk of surgery to include the following:  1. Infection (prohylactic antibiotics will be administered)  2. DVT/Pulmonary Embolism (prophylactic pneumo compression stockings will be used)  3.Trauma to internal organs requiring additional surgical  procedure to repair any injury to     Internal organs requiring perhaps additional hospitalization days.  4.Hemmorhage requiring transfusion and blood products which carry risks such as  anaphylactic reaction, hepatitis and AIDS  Patient had received literature information on the procedure scheduled and all her questions were answered and fully accepts all risk.   [facility] HMD9:20 AMTD@Note : This dictation was prepared with  Dragon/digital dictation along withSmart phrase technology. Any transcriptional errors that result from this process are unintentional.      Terrance Mass 04/24/2014, 8:26 AM  Note: This dictation was prepared with  Dragon/digital dictation along withSmart phrase technology. Any transcriptional errors that result from this process are unintentional.

## 2014-05-05 NOTE — Interval H&P Note (Signed)
History and Physical Interval Note:  05/05/2014 7:21 AM  Jamie Montes  has presented today for surgery, with the diagnosis of VULVAR CONDYLOMA   The various methods of treatment have been discussed with the patient and family. After consideration of risks, benefits and other options for treatment, the patient has consented to  Procedure(s): CO2 LASER VAPORIZATION OF VULVAR CONDYLOMA (N/A) EXCISION OF VULVAR/PERINEAL LESIONS (N/A) as a surgical intervention .  The patient's history has been reviewed, patient examined, no change in status, stable for surgery.  I have reviewed the patient's chart and labs.  Questions were answered to the patient's satisfaction.     Terrance Mass

## 2014-05-05 NOTE — Op Note (Signed)
   Operative Note  05/05/2014  8:51 AM  PATIENT:  Jamie Montes  58 y.o. female  PRE-OPERATIVE DIAGNOSIS:  VULVAR CONDYLOMAS and hyperplastic lesions, right inguinal crease and mons pubis skin tags  POST-OPERATIVE DIAGNOSIS: Same as above  PROCEDURE:  Procedure(s): Colposcopy, CO2 LASER VAPORIZATION OF VULVAR CONDYLOMA lesions and hyperplastic lesions EXCISION OF right inguinal crease and mons pubis skin tags  SURGEON:  Surgeon(s): Terrance Mass, MD  ANESTHESIA:   general  FINDINGS: Patient with 2 large skin tags one located at the mons pubis the second one located at the right inguinal crease. Patient had a perirectal condylomatous growth and 3 previous biopsy sites to include the left perineum, inferior portion of right labia majora, and superior portion of the left labia majora hyperplastic areas.  DESCRIPTION OF OPERATION: The patient was taken to the operating room where she underwent successful general endotracheal anesthesia. A time out was undertaken for proper identification of the patient and discussing out loud the procedure to be performed. The patient was then placed in the high lithotomy position. A red rubber catheter was inserted into the bladder to evacuate its contents for approximately 30 cc withdrawn. Wet towels were placed surrounding the external genitalia due to the fact that we were to use CO2 laser for safety. 1% lidocaine was infiltrated at the base of all the lesions outlined above for a total of 15 cc. Betadine solution was applied to these areas as an antiseptic. The mons pubis lesion as well as the right inguinal crease lesion were excised with the scalpel and submitted for pathological evaluation. The Bovie was utilized to cauterize the small vessels at the base. The skin edges and subdermal tissue were reapproximated with running interrupted suture of 3-0  Rapide. Of note prior to this the colposcope was brought into view and the entire external  genitalia, perineum and perirectal region were inspected and  lesions outlined above were treated accordingly. The CO2 laser was brought in and with the handheld piece the CO2 laser beam was set at 8 W continuous mode and with a paint brush stroke technique each of the lesions noted above were individually ablated to a depth of approximately 2-3 mm as well as 5 mm extension outside the lesions. Upon completion Silvadene cream was applied to all the above-mentioned areas. The patient was extubated transferred to recovery room stable vital signs blood loss was minimal patient received Toradol 30 mg IV in route to the recovery room.  ESTIMATED BLOOD LOSS: Minimal   Intake/Output Summary (Last 24 hours) at 05/05/14 0851 Last data filed at 05/05/14 3329  Gross per 24 hour  Intake   2300 ml  Output     60 ml  Net   2240 ml     BLOOD ADMINISTERED:none   LOCAL MEDICATIONS USED:  XYLOCAINE 1% infiltrated subdermally on all lesions total of 15 cc    SPECIMEN:  Source of Specimen:   Mons pubis and right inguinal crease skin tag  DISPOSITION OF SPECIMEN:  PATHOLOGY  COUNTS:  YES  PLAN OF CARE: Transfer to PACU  [facility] HMD8:51 AMTD@  Note: This dictation was prepared with  Dragon/digital dictation along withSmart phrase technology. Any transcriptional errors that result from this process are unintentional.

## 2014-05-05 NOTE — Anesthesia Preprocedure Evaluation (Signed)
Anesthesia Evaluation  Patient identified by MRN, date of birth, ID band Patient awake    Reviewed: Allergy & Precautions, H&P , Patient's Chart, lab work & pertinent test results, reviewed documented beta blocker date and time   History of Anesthesia Complications Negative for: history of anesthetic complications  Airway Mallampati: III TM Distance: >3 FB Neck ROM: full    Dental   Pulmonary former smoker,  breath sounds clear to auscultation        Cardiovascular Exercise Tolerance: Good hypertension, Rhythm:regular Rate:Normal     Neuro/Psych negative psych ROS   GI/Hepatic   Endo/Other  Morbid obesity  Renal/GU      Musculoskeletal   Abdominal   Peds  Hematology  (+) anemia ,   Anesthesia Other Findings   Reproductive/Obstetrics                           Anesthesia Physical Anesthesia Plan  ASA: III  Anesthesia Plan: General LMA   Post-op Pain Management:    Induction:   Airway Management Planned:   Additional Equipment:   Intra-op Plan:   Post-operative Plan:   Informed Consent: I have reviewed the patients History and Physical, chart, labs and discussed the procedure including the risks, benefits and alternatives for the proposed anesthesia with the patient or authorized representative who has indicated his/her understanding and acceptance.   Dental Advisory Given  Plan Discussed with: CRNA, Surgeon and Anesthesiologist  Anesthesia Plan Comments:         Anesthesia Quick Evaluation

## 2014-05-05 NOTE — Transfer of Care (Signed)
Immediate Anesthesia Transfer of Care Note  Patient: Jamie Montes  Procedure(s) Performed: Procedure(s): CO2 LASER VAPORIZATION OF VULVAR CONDYLOMA (N/A) EXCISION OF VULVAR/PERINEAL LESIONS (N/A)  Patient Location: PACU  Anesthesia Type:General  Level of Consciousness: awake, alert  and oriented  Airway & Oxygen Therapy: Patient Spontanous Breathing and Patient connected to nasal cannula oxygen  Post-op Assessment: Report given to PACU RN and Post -op Vital signs reviewed and stable  Post vital signs: Reviewed and stable  Complications: No apparent anesthesia complications

## 2014-05-08 ENCOUNTER — Encounter (HOSPITAL_COMMUNITY): Payer: Self-pay | Admitting: Gynecology

## 2014-05-16 ENCOUNTER — Encounter: Payer: Self-pay | Admitting: Gynecology

## 2014-05-16 ENCOUNTER — Ambulatory Visit (INDEPENDENT_AMBULATORY_CARE_PROVIDER_SITE_OTHER): Payer: Managed Care, Other (non HMO) | Admitting: Gynecology

## 2014-05-16 VITALS — BP 130/82

## 2014-05-16 DIAGNOSIS — Z09 Encounter for follow-up examination after completed treatment for conditions other than malignant neoplasm: Secondary | ICD-10-CM

## 2014-05-16 DIAGNOSIS — G4700 Insomnia, unspecified: Secondary | ICD-10-CM | POA: Insufficient documentation

## 2014-05-16 MED ORDER — LIDOCAINE 5 % EX OINT: 1.0000 "application " | TOPICAL_OINTMENT | CUTANEOUS | Status: DC | PRN

## 2014-05-16 MED ORDER — ZOLPIDEM TARTRATE 10 MG PO TABS: 10.0000 mg | ORAL_TABLET | Freq: Every evening | ORAL | Status: DC | PRN

## 2014-05-16 NOTE — Progress Notes (Signed)
   Patient presented to the office today for her postop visit. Patient status post CO2 laser vaporization of vulvar condyloma lesion and hyperplastic lesions as well as excision of right inguinal crease and months pubis skin tag. Patient has been applying Silvadene cream at bedtime and is sore but otherwise doing well.  Genitourinary:    The above areas were the areas treated.  Pathology report from the 2 lesions removed at surgery were as follows: Diagnosis 1. Vulva, biopsy, mons pubis lesion - MELANOCYTIC NEVUS, IRRITATED. 2. Vulva, biopsy, right inguinal lesion (je) - MELANOCYTIC NEVUS  The patient will be instructed to use 2% lidocaine gel when necessary during the day. She is to do sitz baths 1-2 times a week in the evening before bedtime to apply the Silvadene cream. She will return back to the office in 6 weeks for followup. For her and sono she is going to be prescribed Ambien 10 mg to take 1 by mouth at bedtime when necessary

## 2014-06-27 ENCOUNTER — Ambulatory Visit (INDEPENDENT_AMBULATORY_CARE_PROVIDER_SITE_OTHER): Payer: Managed Care, Other (non HMO) | Admitting: Gynecology

## 2014-06-27 ENCOUNTER — Encounter: Payer: Self-pay | Admitting: Gynecology

## 2014-06-27 VITALS — BP 138/82

## 2014-06-27 DIAGNOSIS — Z09 Encounter for follow-up examination after completed treatment for conditions other than malignant neoplasm: Secondary | ICD-10-CM

## 2014-06-27 DIAGNOSIS — Z23 Encounter for immunization: Secondary | ICD-10-CM

## 2014-06-27 NOTE — Progress Notes (Signed)
   Patient presented to the office today for final postop visit. 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 Also she had excision of a right inguinal crease and mons pubis skin tags. Her preop pathology report from lesions was as follows:  Diagnosis 1. Vulva, biopsy, left labia majora BENIGN EPIDERMAL HYPERPLASIA, SEE DESCRIPTION 2. Vulva, biopsy, right labia majora BENIGN EPIDERMAL HYPERPLASIA, SEE DESCRIPTION 3. Perineum, biopsy, left side BENIGN EPIDERMAL HYPERPLASIA, SEE DESCRIPTION Microscopic Comment 1. -3. There is benign papillary epidermal hyperplasia with hyperkeratosis. There is a lymphocytic infiltrate. No diagnostic viral changes are observed. Considerations in this setting are reactive epidermal hyperplasia such as lichen simplex chronicus or evolving prurigo nodule, seborrheic keratosis, or non diagnostic condyloma. PAS stain is negative for fungal organism. There is no evidence of VIN or malignancy.  Lesions removed at time of surgery pathology report was as follows:  Diagnosis  1. Vulva, biopsy, mons pubis lesion  - MELANOCYTIC NEVUS, IRRITATED.  2. Vulva, biopsy, right inguinal lesion Ardeen Fillers)  - MELANOCYTIC NEVUS  Exam: External genitalia completely healed.  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 Patient is returned to normal activity. Patient scheduled to return back next year for her annual exam. Issue receive her flu vaccine today. Fecal Hemoccult cards were provided for dyspnea to the office for testing. Patient scheduled for mammogram at the end of the month.

## 2014-06-27 NOTE — Addendum Note (Signed)
Addended by: Thurnell Garbe A on: 06/27/2014 10:23 AM   Modules accepted: Orders

## 2014-06-27 NOTE — Patient Instructions (Signed)
Influenza Virus Vaccine injection (Fluarix) Qu es este medicamento? La VACUNA ANTIGRIPAL ayuda a disminuir el riesgo de contraer la influenza, tambin conocida como la gripe. La vacuna solo ayuda a protegerle contra algunas cepas de influenza. Esta vacuna no ayuda a reducir Catering manager de contraer influenza pandmica H1N1. Este medicamento puede ser utilizado para otros usos; si tiene alguna pregunta consulte con su proveedor de atencin mdica o con su farmacutico. MARCAS COMERCIALES DISPONIBLES: Fluarix, Fluzone Qu le debo informar a mi profesional de la salud antes de tomar este medicamento? Necesita saber si usted presenta alguno de los siguientes problemas o situaciones: -trastorno de sangrado como hemofilia -fiebre o infeccin -sndrome de Guillain-Barre u otros problemas neurolgicos -problemas del sistema inmunolgico -infeccin por el virus de la inmunodeficiencia humana (VIH) o SIDA -niveles bajos de plaquetas en la sangre -esclerosis mltiple -una Risk analyst o inusual a las vacunas antigripales, a los huevos, protenas de pollo, al ltex, a la gentamicina, a otros medicamentos, alimentos, colorantes o conservantes -si est embarazada o buscando quedar embarazada -si est amamantando a un beb Cmo debo utilizar este medicamento? Esta vacuna se administra mediante inyeccin por va intramuscular. Lo administra un profesional de [employer]	[place]. Recibir una copia de informacin escrita sobre la vacuna antes de cada vacuna. Asegrese de leer este folleto cada vez cuidadosamente. Este folleto puede cambiar con frecuencia. Hable con su pediatra para informarse acerca del uso de este medicamento en nios. Puede requerir atencin especial. Sobredosis: Pngase en contacto inmediatamente con un centro toxicolgico o una sala de urgencia si usted cree que haya tomado demasiado medicamento. ATENCIN: [employer] es solo para usted. No comparta este medicamento con nadie. Qu sucede  si me olvido de una dosis? No se aplica en este caso. Qu puede interactuar con este medicamento? -quimioterapia o radioterapia -medicamentos que suprimen el sistema inmunolgico, tales como etanercept, anakinra, infliximab y adalimumab -medicamentos que tratan o previenen cogulos sanguneos, como warfarina -fenitona -medicamentos esteroideos, como la prednisona o la cortisona -teofilina -vacunas Puede ser que esta lista no menciona todas las posibles interacciones. Informe a su profesional de [employer]	[place] de [employer] productos a base de hierbas, medicamentos de [employer] o suplementos nutritivos que est tomando. Si usted fuma, consume bebidas alcohlicas o si utiliza drogas ilegales, indqueselo tambin a su profesional de [employer]	[place]. Algunas sustancias pueden interactuar con su medicamento. A qu debo estar atento al usar [employer]? Informe a su mdico o a Barrister's clerk de la [employer] todos los efectos secundarios que persistan despus de 3 das. Llame a su proveedor de atencin mdica si se presentan sntomas inusuales dentro de las 6 semanas posteriores a la vacunacin. Es posible que todava pueda contraer la gripe, pero la enfermedad no ser tan fuerte como normalmente. No puede contraer la gripe de esta vacuna. La vacuna antigripal no le protege contra resfros u otras enfermedades que pueden causar [place]. Debe vacunarse cada ao. Qu efectos secundarios puedo tener al [employer] este medicamento? Efectos secundarios que debe informar a su mdico o a Barrister's clerk de la salud tan pronto como sea posible: -reacciones alrgicas como erupcin cutnea, picazn o urticarias, hinchazn de la cara, labios o lengua Efectos secundarios que, por lo general, no requieren atencin mdica (debe informarlos a su mdico o a su profesional de la salud si persisten o si son molestos): -fiebre -dolor de cabeza -molestias y dolores musculares -dolor, sensibilidad, enrojecimiento o Database administrator de la inyeccin -cansancio o debilidad Puede ser que [employer]  no menciona todos los posibles efectos secundarios. Comunquese a su mdico por asesoramiento mdico [employer]. Usted puede informar los efectos secundarios a la FDA por telfono al 1-800-FDA-1088. Dnde debo guardar mi medicina? Esta vacuna se administra solamente en clnicas, farmacias, consultorio mdico u otro consultorio de un profesional de la salud y no Sports coach en su domicilio. ATENCIN: Este folleto es un resumen. Puede ser que no cubra toda la posible informacin. Si usted tiene preguntas acerca de esta medicina, consulte con su mdico, su farmacutico o su profesional de Technical sales engineer.  2015, Elsevier/Gold Standard. (2010-03-12 15:31:40)

## 2014-07-24 ENCOUNTER — Encounter: Payer: Self-pay | Admitting: Gynecology

## 2014-07-28 ENCOUNTER — Other Ambulatory Visit: Payer: Managed Care, Other (non HMO) | Admitting: Anesthesiology

## 2014-07-28 DIAGNOSIS — Z1211 Encounter for screening for malignant neoplasm of colon: Secondary | ICD-10-CM

## 2015-03-24 ENCOUNTER — Encounter (HOSPITAL_BASED_OUTPATIENT_CLINIC_OR_DEPARTMENT_OTHER): Payer: Self-pay | Admitting: *Deleted

## 2015-03-24 ENCOUNTER — Emergency Department (HOSPITAL_BASED_OUTPATIENT_CLINIC_OR_DEPARTMENT_OTHER): Payer: Managed Care, Other (non HMO)

## 2015-03-24 ENCOUNTER — Emergency Department (HOSPITAL_BASED_OUTPATIENT_CLINIC_OR_DEPARTMENT_OTHER)
Admission: EM | Admit: 2015-03-24 | Discharge: 2015-03-24 | Disposition: A | Discharge status: Home / Self Care | Payer: Managed Care, Other (non HMO) | Attending: Emergency Medicine | Admitting: Emergency Medicine

## 2015-03-24 DIAGNOSIS — Z8739 Personal history of other diseases of the musculoskeletal system and connective tissue: Secondary | ICD-10-CM | POA: Diagnosis not present

## 2015-03-24 DIAGNOSIS — R200 Anesthesia of skin: Secondary | ICD-10-CM | POA: Insufficient documentation

## 2015-03-24 DIAGNOSIS — Z792 Long term (current) use of antibiotics: Secondary | ICD-10-CM | POA: Insufficient documentation

## 2015-03-24 DIAGNOSIS — Z862 Personal history of diseases of the blood and blood-forming organs and certain disorders involving the immune mechanism: Secondary | ICD-10-CM | POA: Diagnosis not present

## 2015-03-24 DIAGNOSIS — R519 Headache, unspecified: Secondary | ICD-10-CM

## 2015-03-24 DIAGNOSIS — Z87728 Personal history of other specified (corrected) congenital malformations of nervous system and sense organs: Secondary | ICD-10-CM | POA: Diagnosis not present

## 2015-03-24 DIAGNOSIS — Z86010 Personal history of colonic polyps: Secondary | ICD-10-CM | POA: Insufficient documentation

## 2015-03-24 DIAGNOSIS — Z8619 Personal history of other infectious and parasitic diseases: Secondary | ICD-10-CM | POA: Insufficient documentation

## 2015-03-24 DIAGNOSIS — I10 Essential (primary) hypertension: Secondary | ICD-10-CM | POA: Insufficient documentation

## 2015-03-24 DIAGNOSIS — Z79899 Other long term (current) drug therapy: Secondary | ICD-10-CM | POA: Insufficient documentation

## 2015-03-24 DIAGNOSIS — Z8639 Personal history of other endocrine, nutritional and metabolic disease: Secondary | ICD-10-CM | POA: Insufficient documentation

## 2015-03-24 DIAGNOSIS — Z87891 Personal history of nicotine dependence: Secondary | ICD-10-CM | POA: Diagnosis not present

## 2015-03-24 DIAGNOSIS — R51 Headache: Secondary | ICD-10-CM | POA: Diagnosis present

## 2015-03-24 MED ORDER — DIPHENHYDRAMINE HCL 50 MG/ML IJ SOLN
25.0000 mg | Freq: Once | INTRAMUSCULAR | Status: AC
  Administered 2015-03-24: 25 mg via INTRAVENOUS
  Filled 2015-03-24: qty 1

## 2015-03-24 MED ORDER — DEXAMETHASONE SODIUM PHOSPHATE 10 MG/ML IJ SOLN
10.0000 mg | Freq: Once | INTRAMUSCULAR | Status: AC
  Administered 2015-03-24: 10 mg via INTRAVENOUS
  Filled 2015-03-24: qty 1

## 2015-03-24 MED ORDER — METOCLOPRAMIDE HCL 5 MG/ML IJ SOLN
10.0000 mg | Freq: Once | INTRAMUSCULAR | Status: AC
  Administered 2015-03-24: 10 mg via INTRAVENOUS
  Filled 2015-03-24: qty 2

## 2015-03-24 NOTE — ED Notes (Signed)
Pt speaks Spanish, family in room to assist with translation and interpreter phone offered.

## 2015-03-24 NOTE — ED Notes (Signed)
Pt does not speak english- daughter is interpreting- c/o headache 10/10 since 1200 yesterday- +nausea, numbness to both cheeks, denies light sensitivity- daughter states pt's behavior is appropriate, no change from her norm-

## 2015-03-24 NOTE — ED Provider Notes (Signed)
CSN: [id number]     Arrival date & time 03/24/15  1331 History  This chart was scribed for Jamie Dessert, MD by Julien Nordmann, ED Scribe. This patient was seen in room MH07/MH07 and the patient's care was started at 2:58 PM.    Chief Complaint  Patient presents with  . Headache      The history is provided by the patient and a relative. No language interpreter was used.    HPI Comments: Jamie Montes is a 59 y.o. female who has a hx of HTN and chiari malformation surgical repair in 2013 who presents to the Emergency Department complaining of a intermittent, headache onset yesterday afternoon. She notes her head pain is all over her head in the front and the back more so on the left, and notes having associated numbness in her bilateral cheeks. Pt has some nausea yesterday but says it is not present today. Pt says she has a pulsating pain behind her left ear as well. Relative notes that pt has had headaches like this before but not this severe. Pt says her headache is not as bad as it was yesterday. She notes taking OTC ibuprofen yesterday to alleviate the pain with minimal relief.  Pt denies visual disturbances, change in balance, neck pain, sensitive to light, numbness in legs/arm, fever, tick bites, recent travel. Pt has no known drug allergies.   Past Medical History  Diagnosis Date  . NSVD (normal spontaneous vaginal delivery)     X2  . Condyloma     VULVAR / PERINEAL  . Vitamin D deficiency   . Hypertension   . History of colonic polyps   . Anemia   . Arthritis    Past Surgical History  Procedure Laterality Date  . 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 Dilation and curettage of uterus      IST TRIMESTER SAB  . Resectoscopic polypectomy    . Lipoma excision      LEFT ARM/shoulder  . Suboccipital craniectomy cervical laminectomy  04/12/2012    Procedure: SUBOCCIPITAL CRANIECTOMY CERVICAL LAMINECTOMY/DURAPLASTY;  Surgeon: Hosie Spangle, MD;  Location: [facility];  Service: Neurosurgery;  Laterality: N/A;  suboccipital craniectomy with upper cervical laminectomy with duroplasty  . Co2 laser application N/A 01/08/3789    Procedure: CO2 LASER VAPORIZATION OF VULVAR CONDYLOMA;  Surgeon: Terrance Mass, MD;  Location: [facility];  Service: Gynecology;  Laterality: N/A;  . Lesion removal N/A 05/05/2014    Procedure: EXCISION OF VULVAR/PERINEAL LESIONS;  Surgeon: Terrance Mass, MD;  Location: [facility];  Service: Gynecology;  Laterality: N/A;   Family History  Problem Relation Age of Onset  . Hypertension Mother   . Diabetes Mother   . Cancer Maternal Aunt 74    COLON   History  Substance Use Topics  . Smoking status: Former Smoker    Quit date: 05/01/1998  . Smokeless tobacco: Never Used  . Alcohol Use: No   OB History    Gravida Para Term Preterm AB TAB SAB Ectopic Multiple Living   2 2        2      Review of Systems  A complete 10 system review of systems was obtained and all systems are negative except as noted in the HPI and PMH.    Allergies  Review of patient's allergies indicates no known allergies.  Home Medications   Prior to Admission medications  Medication Sig Start Date End Date Taking? Authorizing Provider  hydrochlorothiazide (MICROZIDE) 12.5 MG capsule Take 12.5 mg by mouth daily.   Yes Historical Provider, MD  metoprolol succinate (TOPROL-XL) 100 MG 24 hr tablet Take 100 mg by mouth daily. Take with or immediately following a meal.   Yes Historical Provider, MD  lidocaine (XYLOCAINE) 5 % ointment Apply 1 application topically as needed. 05/16/14   Terrance Mass, MD  metoCLOPramide (REGLAN) 10 MG tablet Take 1 tablet (10 mg total) by mouth 3 (three) times daily with meals. 04/24/14   Terrance Mass, MD  oxyCODONE-acetaminophen (ROXICET) 5-325 MG per tablet Take 1-2 tablets by mouth every 6 (six) hours as needed for severe pain. 04/24/14   Terrance Mass, MD  silver sulfADIAZINE  (SILVADENE) 1 % cream Apply 1 application topically daily. 04/24/14   Terrance Mass, MD  zolpidem (AMBIEN) 10 MG tablet Take 1 tablet (10 mg total) by mouth at bedtime as needed for sleep. 05/16/14 06/15/14  Terrance Mass, MD   Triage vitals: BP 146/66 mmHg  Pulse 69  Temp(Src) 98.2 F (36.8 C) (Oral)  Resp 18  Ht 5\' 3"  (1.6 m)  Wt 230 lb (104.327 kg)  BMI 40.75 kg/m2  SpO2 96%  LMP 08/25/2011 Physical Exam  Constitutional: She is oriented to person, place, and time. She appears well-developed and well-nourished. No distress.  HENT:  Head: Normocephalic and atraumatic.  Eyes: Conjunctivae and EOM are normal. Pupils are equal, round, and reactive to light. No scleral icterus.  Fundoscopic exam:      The right eye shows no papilledema.       The left eye shows no papilledema.  Neck: Normal range of motion. Neck supple. No thyromegaly present.  Cardiovascular: Normal rate, regular rhythm, normal heart sounds and intact distal pulses.  Exam reveals no gallop and no friction rub.   No murmur heard. Pulmonary/Chest: Effort normal and breath sounds normal. No respiratory distress. She has no wheezes. She has no rales.  Abdominal: Soft. Bowel sounds are normal. She exhibits no distension. There is no tenderness. There is no rebound and no guarding.  Musculoskeletal: Normal range of motion. She exhibits no tenderness.  No edema  Lymphadenopathy:    She has no cervical adenopathy.  Neurological: She is alert and oriented to person, place, and time. She has normal strength. No cranial nerve deficit or sensory deficit. Coordination and gait normal.  No photophobia.  5/5 strength in all ext  Skin: Skin is warm and dry. No rash noted.  Psychiatric: She has a normal mood and affect. Her behavior is normal.  Nursing note and vitals reviewed.   ED Course  Procedures  DIAGNOSTIC STUDIES: Oxygen Saturation is 96% on RA, normal by my interpretation.  COORDINATION OF CARE:  3:05 PM Discussed  treatment plan which includes CT of head, pain medication with pt at bedside and pt agreed to plan.  Labs Review Labs Reviewed - No data to display  Imaging Review Ct Head Wo Contrast  03/24/2015   CLINICAL DATA:  Severe headache.  EXAM: CT HEAD WITHOUT CONTRAST  TECHNIQUE: Contiguous axial images were obtained from the base of the skull through the vertex without intravenous contrast.  COMPARISON:  CT scan dated 09/30/2012  FINDINGS: Prior posterior occipital decompression and upper cervical decompression. There is no acute intracranial hemorrhage, infarction, or mass lesion. 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 No acute osseous abnormality.  IMPRESSION: No acute abnormality.   Electronically Signed  By: Lorriane Shire M.D.   On: 03/24/2015 15:40     EKG Interpretation None      MDM   Final diagnoses:  Headache    Pt with a hx of craniotomy for chiari malformation 2013 who presents today with a severe headache starting yesterday around noon which is better today.  Took ibuprofen yesterday with some improvement.  No photophobia or vomiting but mild nausea and yesterday constant numbness in both cheeks.  Today headache is better but has been intermittent with intermittent numbness in the cheeks.  No vision changes or issues in extremities.  Normal behavior and no aphasia or swallowing difficulty.  Denies trauma to the head, infectious sx or change in meds.  Pt does get headaches occasionally but this is more severe.   SAH(sudden onset, worst of life, or deficits) is possible but not worst of life and did not start suddenly. Low suspicion for infection, or cavernous vein thrombosis.  Normal neuro exam and vital signs. Will give HA cocktail and get CT to further evaluate.  4:35 PM CT negative for acute abnormality. Patient is completely pain-free after headache cocktail. A first or return precautions and follow-up with her doctor if headaches become more regular. Most likely a  migraine headache. I personally performed the services described in this documentation, which was scribed in my presence.  The recorded information has been reviewed and considered.    Jamie Dessert, MD 03/24/15 [phone]

## 2015-03-24 NOTE — ED Notes (Signed)
Pt transported to CT by radiology technician

## 2015-03-24 NOTE — ED Notes (Signed)
Pt states severe headache yesterday, starting around noon.  Today headache remains, but is not as painful, but patient is now concerned by a feeling that her cheeks have "fallen asleep."  Pt having difficulty fully smiling due to feeling in bilateral cheeks.  Pt states a small amount of nausea yesterday, but denies any at present.  Pt has not taken any pain medication today, only her BP medication.

## 2015-06-24 ENCOUNTER — Emergency Department (HOSPITAL_COMMUNITY)
Admission: EM | Admit: 2015-06-24 | Discharge: 2015-06-24 | Disposition: A | Discharge status: Home / Self Care | Payer: Managed Care, Other (non HMO) | Attending: Emergency Medicine | Admitting: Emergency Medicine

## 2015-06-24 ENCOUNTER — Emergency Department (HOSPITAL_COMMUNITY): Payer: Managed Care, Other (non HMO)

## 2015-06-24 ENCOUNTER — Encounter (HOSPITAL_COMMUNITY): Payer: Self-pay | Admitting: Emergency Medicine

## 2015-06-24 DIAGNOSIS — I10 Essential (primary) hypertension: Secondary | ICD-10-CM | POA: Diagnosis not present

## 2015-06-24 DIAGNOSIS — M1990 Unspecified osteoarthritis, unspecified site: Secondary | ICD-10-CM | POA: Insufficient documentation

## 2015-06-24 DIAGNOSIS — Z86010 Personal history of colonic polyps: Secondary | ICD-10-CM | POA: Diagnosis not present

## 2015-06-24 DIAGNOSIS — R079 Chest pain, unspecified: Secondary | ICD-10-CM

## 2015-06-24 DIAGNOSIS — Z79899 Other long term (current) drug therapy: Secondary | ICD-10-CM | POA: Diagnosis not present

## 2015-06-24 DIAGNOSIS — Z862 Personal history of diseases of the blood and blood-forming organs and certain disorders involving the immune mechanism: Secondary | ICD-10-CM | POA: Diagnosis not present

## 2015-06-24 DIAGNOSIS — Z87891 Personal history of nicotine dependence: Secondary | ICD-10-CM | POA: Insufficient documentation

## 2015-06-24 LAB — CBC
HEMATOCRIT: 34.7 % — AB (ref 36.0–46.0)
HEMOGLOBIN: 11.4 g/dL — AB (ref 12.0–15.0)
MCH: 29.2 pg (ref 26.0–34.0)
MCHC: 32.9 g/dL (ref 30.0–36.0)
MCV: 88.7 fL (ref 78.0–100.0)
Platelets: 192 10*3/uL (ref 150–400)
RBC: 3.91 MIL/uL (ref 3.87–5.11)
RDW: 13.4 % (ref 11.5–15.5)
WBC: 8.9 10*3/uL (ref 4.0–10.5)

## 2015-06-24 LAB — BASIC METABOLIC PANEL
ANION GAP: 8 (ref 5–15)
BUN: 16 mg/dL (ref 6–20)
CHLORIDE: 101 mmol/L (ref 101–111)
CO2: 26 mmol/L (ref 22–32)
Calcium: 8.8 mg/dL — ABNORMAL LOW (ref 8.9–10.3)
Creatinine, Ser: 0.79 mg/dL (ref 0.44–1.00)
GFR calc non Af Amer: >60 mL/min (ref >60)
Glucose, Bld: 104 mg/dL — ABNORMAL HIGH (ref 65–99)
POTASSIUM: 3.3 mmol/L — AB (ref 3.5–5.1)
SODIUM: 135 mmol/L (ref 135–145)

## 2015-06-24 LAB — I-STAT TROPONIN, ED
TROPONIN I, POC: 0 ng/mL (ref 0.00–0.08)
Troponin i, poc: 0 ng/mL (ref 0.00–0.08)

## 2015-06-24 NOTE — ED Notes (Signed)
MD at bedside. 

## 2015-06-24 NOTE — ED Provider Notes (Signed)
CSN: [id number]     Arrival date & time 06/24/15  1648 History   First MD Initiated Contact with Patient 06/24/15 1711     Chief Complaint  Patient presents with  . Chest Pain    The history is provided by the patient, medical records, the EMS personnel and a relative.     59 year old female with history of hypertension, arthritis, anemia presenting with chest pain. Onset was this morning, preceded by a feeling of paresthesias in bilateral upper extremities and in setting of recent stressors. Described as both sharp and pressure. The located in left chest. Nonradiating. Severity 8 out of 10. Associated with nausea. Alleviated completely by nitroglycerin 2 and aspirin given by EMS. EMS also noted patient was hypertensive to systolic blood pressures of 200s though this improved after nitroglycerin as well and upon arrival patient is normotensive. Denies recent fevers, cough, hemoptysis, leg swelling, diaphoresis, shortness of breath.   Past Medical History  Diagnosis Date  . NSVD (normal spontaneous vaginal delivery)     X2  . Condyloma     VULVAR / PERINEAL  . Vitamin D deficiency   . Hypertension   . History of colonic polyps   . Anemia   . Arthritis    Past Surgical History  Procedure Laterality Date  . 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 Dilation and curettage of uterus      IST TRIMESTER SAB  . Resectoscopic polypectomy    . Lipoma excision      LEFT ARM/shoulder  . Suboccipital craniectomy cervical laminectomy  04/12/2012    Procedure: SUBOCCIPITAL CRANIECTOMY CERVICAL LAMINECTOMY/DURAPLASTY;  Surgeon: Hosie Spangle, MD;  Location: [facility];  Service: Neurosurgery;  Laterality: N/A;  suboccipital craniectomy with upper cervical laminectomy with duroplasty  . Co2 laser application N/A 5/78/4696    Procedure: CO2 LASER VAPORIZATION OF VULVAR CONDYLOMA;  Surgeon: Terrance Mass, MD;  Location: [facility];  Service: Gynecology;   Laterality: N/A;  . Lesion removal N/A 05/05/2014    Procedure: EXCISION OF VULVAR/PERINEAL LESIONS;  Surgeon: Terrance Mass, MD;  Location: [facility];  Service: Gynecology;  Laterality: N/A;   Family History  Problem Relation Age of Onset  . Hypertension Mother   . Diabetes Mother   . Cancer Maternal Aunt 74    COLON   Social History  Substance Use Topics  . Smoking status: Former Smoker    Quit date: 05/01/1998  . Smokeless tobacco: Never Used  . Alcohol Use: No   OB History    Gravida Para Term Preterm AB TAB SAB Ectopic Multiple Living   2 2        2      Review of Systems  Constitutional: Negative for fever.  HENT: Negative for rhinorrhea.   Eyes: Negative for visual disturbance.  Respiratory: Negative for shortness of breath.   Cardiovascular: Positive for chest pain. Negative for leg swelling.  Gastrointestinal: Positive for nausea. Negative for vomiting and abdominal pain.  Genitourinary: Negative for decreased urine volume.  Skin: Negative for rash.  Allergic/Immunologic: Negative for immunocompromised state.  Neurological: Positive for numbness (paresthesias bilat upper and lower extremities). Negative for syncope.  Psychiatric/Behavioral: Negative for confusion.    Allergies  Review of patient's allergies indicates no known allergies.  Home Medications   Prior to Admission medications   Medication Sig Start Date End Date Taking? Authorizing Provider  hydrochlorothiazide (MICROZIDE) 12.5 MG capsule Take 12.5  mg by mouth daily.   Yes Historical Provider, MD  ibuprofen (ADVIL,MOTRIN) 200 MG tablet Take 200 mg by mouth every 6 (six) hours as needed for moderate pain.   Yes Historical Provider, MD  metoprolol succinate (TOPROL-XL) 100 MG 24 hr tablet Take 100 mg by mouth daily. Take with or immediately following a meal.   Yes Historical Provider, MD  lidocaine (XYLOCAINE) 5 % ointment Apply 1 application topically as needed. 05/16/14   Terrance Mass, MD   metoCLOPramide (REGLAN) 10 MG tablet Take 1 tablet (10 mg total) by mouth 3 (three) times daily with meals. 04/24/14   Terrance Mass, MD  oxyCODONE-acetaminophen (ROXICET) 5-325 MG per tablet Take 1-2 tablets by mouth every 6 (six) hours as needed for severe pain. 04/24/14   Terrance Mass, MD  silver sulfADIAZINE (SILVADENE) 1 % cream Apply 1 application topically daily. 04/24/14   Terrance Mass, MD  zolpidem (AMBIEN) 10 MG tablet Take 1 tablet (10 mg total) by mouth at bedtime as needed for sleep. 05/16/14 06/15/14  Terrance Mass, MD   BP 122/41 mmHg  Pulse 59  Temp(Src) 98.1 F (36.7 C) (Oral)  Resp 18  Ht 5\' 2"  (1.575 m)  Wt 230 lb (104.327 kg)  BMI 42.06 kg/m2  SpO2 95%  LMP 08/25/2011 Physical Exam  Constitutional: She is oriented to person, place, and time. She appears well-developed and well-nourished. No distress.  HENT:  Head: Normocephalic and atraumatic.  Eyes: Right eye exhibits no discharge. Left eye exhibits no discharge.  Neck: No tracheal deviation present.  Cardiovascular: Normal rate and regular rhythm.   bilat UE blood pressures symmetric with difference in SBP and DBP < 10 mmHg  Pulmonary/Chest: Effort normal and breath sounds normal. No respiratory distress. She exhibits no tenderness.  Abdominal: Soft. She exhibits no distension. There is no tenderness.  Musculoskeletal: She exhibits no edema.  Neurological: She is alert and oriented to person, place, and time.  Skin: Skin is warm and dry. She is not diaphoretic.  Psychiatric: She has a normal mood and affect. Her behavior is normal.    ED Course  Procedures (including critical care time) Labs Review Labs Reviewed  BASIC METABOLIC PANEL - Abnormal; Notable for the following:    Potassium 3.3 (*)    Glucose, Bld 104 (*)    Calcium 8.8 (*)    All other components within normal limits  CBC - Abnormal; Notable for the following:    Hemoglobin 11.4 (*)    HCT 34.7 (*)    All other components within  normal limits  I-STAT TROPOININ, ED  Randolm Idol, ED    Imaging Review Dg Chest 2 View  06/24/2015   CLINICAL DATA:  Onset of chest pain 1530 hours.  Chest tightness.  EXAM: CHEST  2 VIEW  COMPARISON:  04/12/2012  FINDINGS: There is bilateral diffuse interstitial thickening. There is no focal parenchymal opacity. There is no pleural effusion or pneumothorax. The heart and mediastinal contours are unremarkable.  The osseous structures are unremarkable.  IMPRESSION: Mild bilateral diffuse interstitial thickening which may reflect mild interstitial edema versus interstitial infection.   Electronically Signed   By: Kathreen Devoid   On: 06/24/2015 18:28   I have personally reviewed and evaluated these images and lab results as part of my medical decision-making.   EKG Interpretation None      MDM   Final diagnoses:  Chest pain, unspecified chest pain type    59 year old female with history of  hypertension presenting with chest pain as above. Resolved with EMS administration of aspirin and nitroglycerin 2. No further occurrence of pain in ED. Doubt ACS given no acute EKG findings suggestive of ischemia and serial troponins negative. Doubt PNA or infectious etiology as no focal consolidation on chest x-ray, exam inconsistent, no fevers or cough, no leukocytosis. No pneumothorax. Doubt aortic dissection given no widened mediastinum on x-ray and symmetric upper extremity blood pressures, without ongoing pain. Possibly related to stress as suggested by the patient herself. Discharged in stable condition with return precautions, PCP follow-up.   Discussed with Dr. Alvino Chapel, who oversaw management of this patient.    Ivin Booty, MD 06/25/15 0126  Davonna Belling, MD 06/27/15 [phone]

## 2015-06-24 NOTE — Discharge Instructions (Signed)
Dolor de pecho (no especfico) (Chest Pain (Nonspecific)) Con frecuencia es difcil dar un diagnstico especfico de la causa del dolor de [place]. Siempre hay una posibilidad de que el dolor podra estar relacionado con algo grave, como un ataque al corazn o un cogulo sanguneo en los pulmones. Debe someterse a controles con el mdico para ms evaluaciones. CAUSAS   Acidez.  Neumona o bronquitis.  Ansiedad o estrs.  Inflamacin de la zona que rodea al corazn (pericarditis) o a los pulmones (pleuritis o pleuresa).  Un cogulo sanguneo en el pulmn.  Colapso de un pulmn (neumotrax), que puede aparecer de [employer] repentina por s solo (neumotrax espontneo) o debido a un traumatismo en el trax.  Culebrilla (virus del herpes zster). La pared torcica est compuesta por huesos, msculos y Database administrator. Cualquiera de estos puede ser la fuente del dolor.  Puede haber una contusin en los huesos debido a una lesin.  Puede haber un esguince en los msculos o el cartlago ocasionado por la tos o por [place].  El cartlago puede verse afectado por una inflamacin y Engineer, production (costocondritis). DIAGNSTICO  Ileene Hutchinson se necesiten anlisis de laboratorio u otros estudios para Animator causa del Social research officer, government. Adems, puede indicarle que se haga una prueba llamada electrocadiograma (ECG) ambulatorio. El ECG registra los patrones de los latidos cardacos durante 24horas. Adems, pueden hacerle otros estudios, por ejemplo:  Ecocardiograma transtorcico (ETT). Durante IT trainer, se usan ondas sonoras para evaluar el flujo de la sangre a travs del corazn.  Ecocardiograma transesofgico (ETE).  Monitoreo cardaco. Permite que el mdico controle la frecuencia y el ritmo cardaco en tiempo real.  Monitor Holter. Es un dispositivo porttil que [employer] latidos cardacos y [place] a Retail buyer las arritmias cardacas. Le permite al [employer] registrar la actividad [place], si es necesario.  Pruebas de estrs por ejercicio o por medicamentos que aceleran los latidos cardacos. TRATAMIENTO   El tratamiento depende de la causa del dolor de [place]. El tratamiento puede incluir:  Inhibidores de la acidez estomacal.  Antiinflamatorios.  Analgsicos para las enfermedades inflamatorias.  Antibiticos, si hay una infeccin.  Podrn aconsejarle que modifique su estilo de vida. Esto incluye dejar de fumar y evitar el alcohol, la cafena y el chocolate.  Pueden aconsejarle que mantenga la cabeza levantada (elevada) cuando duerme. Esto reduce la probabilidad de que el cido retroceda del estmago al esfago. En la [employer], el dolor de pecho no especfico mejorar en el trmino de 2 a 3das, con reposo y [employer].  INSTRUCCIONES PARA EL CUIDADO EN EL HOGAR   Si le prescriben antibiticos, tmelos tal como se le indic. Termnelos aunque comience a sentirse mejor.  [address] no haga actividades fsicas que provoquen dolor de [place]. Contine con las actividades fsicas tal como se le indic  No consuma ningn producto que contenga tabaco, incluidos cigarrillos, tabaco de Higher education careers adviser o cigarrillos electrnicos.  Evite el consumo de alcohol.  Tome los medicamentos solamente como se lo haya indicado el mdico.  Siga las sugerencias del mdico en lo que respecta a las pruebas adicionales, si el dolor de pecho no desaparece.  Concurra a todas las visitas de control programadas. Si no lo hace, podra desarrollar problemas permanentes (crnicos) relacionados con el dolor. Si hay algn problema para concurrir a una cita, llame para reprogramarla. SOLICITE ATENCIN MDICA SI:   El dolor de pecho no desaparece, incluso despus del tratamiento.  Tiene una erupcin cutnea con ampollas en el  pecho.  Jaclynn Guarneri. SOLICITE ATENCIN MDICA DE [facility] SI:   Aumenta el dolor de pecho o este se irradia hacia el  brazo, el cuello, la [place], la espalda o el abdomen.  Le falta el aire.  La tos empeora, o expectora sangre.  Siente dolor intenso en la espalda o el abdomen.  Se siente nauseoso o vomita.  Siente debilidad intensa.  Se desmaya.  Tiene escalofros. Esto es Engineer, maintenance (IT). No espere a ver si el dolor se pasa. Obtenga ayuda mdica de inmediato. Llame a los servicios de emergencia locales (911 en [place]). No conduzca por sus propios medios [employer] hospital. ASEGRESE DE QUE:   Comprende estas instrucciones.  Controlar su afeccin.  Recibir ayuda de inmediato si no mejora o si empeora. Document Released: 09/08/2005 Document Revised: 09/13/2013 [facility] Patient Information 2015 [place]. This information is not intended to replace advice given to you by your health care provider. Make sure you discuss any questions you have with your health care provider.

## 2015-06-24 NOTE — ED Notes (Signed)
Onset cp 1530, ems patient.  Was at work. Stressed lately.  Initially hypertensive, 208/90.  Received 324 asa and two sl ntg's.  bp decreased to 106/31 after ntg administration.  Cp described as tightness.  Hx of htn.  Takes hctz and metoprolol

## 2015-08-23 ENCOUNTER — Encounter: Payer: Managed Care, Other (non HMO) | Admitting: Gynecology

## 2015-09-28 ENCOUNTER — Ambulatory Visit (INDEPENDENT_AMBULATORY_CARE_PROVIDER_SITE_OTHER): Payer: Managed Care, Other (non HMO) | Admitting: Gynecology

## 2015-09-28 ENCOUNTER — Encounter: Payer: Self-pay | Admitting: Gynecology

## 2015-09-28 ENCOUNTER — Other Ambulatory Visit (HOSPITAL_COMMUNITY)
Admission: RE | Admit: 2015-09-28 | Discharge: 2015-09-28 | Disposition: A | Discharge status: Home / Self Care | Payer: Managed Care, Other (non HMO) | Source: Ambulatory Visit | Attending: Gynecology | Admitting: Gynecology

## 2015-09-28 VITALS — BP 128/86 | Ht 62.0 in | Wt 229.0 lb

## 2015-09-28 DIAGNOSIS — Z780 Asymptomatic menopausal state: Secondary | ICD-10-CM | POA: Diagnosis not present

## 2015-09-28 DIAGNOSIS — Z01419 Encounter for gynecological examination (general) (routine) without abnormal findings: Secondary | ICD-10-CM

## 2015-09-28 DIAGNOSIS — Z1151 Encounter for screening for human papillomavirus (HPV): Secondary | ICD-10-CM | POA: Insufficient documentation

## 2015-09-28 DIAGNOSIS — Z8619 Personal history of other infectious and parasitic diseases: Secondary | ICD-10-CM | POA: Diagnosis not present

## 2015-09-28 DIAGNOSIS — Z860100 Personal history of colon polyps, unspecified: Secondary | ICD-10-CM

## 2015-09-28 DIAGNOSIS — Z8639 Personal history of other endocrine, nutritional and metabolic disease: Secondary | ICD-10-CM | POA: Diagnosis not present

## 2015-09-28 DIAGNOSIS — Z87410 Personal history of cervical dysplasia: Secondary | ICD-10-CM

## 2015-09-28 LAB — CBC WITH DIFFERENTIAL/PLATELET
BASOS ABS: 0.1 10*3/uL (ref 0.0–0.1)
Basophils Relative: 1 % (ref 0–1)
Eosinophils Absolute: 0.3 10*3/uL (ref 0.0–0.7)
Eosinophils Relative: 4 % (ref 0–5)
HCT: 37.5 % (ref 36.0–46.0)
HEMOGLOBIN: 12.3 g/dL (ref 12.0–15.0)
LYMPHS ABS: 2.3 10*3/uL (ref 0.7–4.0)
Lymphocytes Relative: 36 % (ref 12–46)
MCH: 28.2 pg (ref 26.0–34.0)
MCHC: 32.8 g/dL (ref 30.0–36.0)
MCV: 86 fL (ref 78.0–100.0)
MONOS PCT: 7 % (ref 3–12)
MPV: 11.3 fL (ref 8.6–12.4)
Monocytes Absolute: 0.5 10*3/uL (ref 0.1–1.0)
NEUTROS ABS: 3.4 10*3/uL (ref 1.7–7.7)
NEUTROS PCT: 52 % (ref 43–77)
Platelets: 216 10*3/uL (ref 150–400)
RBC: 4.36 MIL/uL (ref 3.87–5.11)
RDW: 14.1 % (ref 11.5–15.5)
WBC: 6.5 10*3/uL (ref 4.0–10.5)

## 2015-09-28 LAB — COMPREHENSIVE METABOLIC PANEL
ALK PHOS: 79 U/L (ref 33–130)
ALT: 19 U/L (ref 6–29)
AST: 20 U/L (ref 10–35)
Albumin: 4.1 g/dL (ref 3.6–5.1)
BILIRUBIN TOTAL: 0.5 mg/dL (ref 0.2–1.2)
BUN: 13 mg/dL (ref 7–25)
CALCIUM: 9.2 mg/dL (ref 8.6–10.4)
CO2: 24 mmol/L (ref 20–31)
Chloride: 105 mmol/L (ref 98–110)
Creat: 0.74 mg/dL (ref 0.50–1.05)
GLUCOSE: 95 mg/dL (ref 65–99)
POTASSIUM: 3.6 mmol/L (ref 3.5–5.3)
Sodium: 139 mmol/L (ref 135–146)
TOTAL PROTEIN: 7.1 g/dL (ref 6.1–8.1)

## 2015-09-28 LAB — LIPID PANEL
CHOLESTEROL: 205 mg/dL — AB (ref 125–200)
HDL: 47 mg/dL (ref >=46)
LDL Cholesterol: 114 mg/dL (ref <130)
Total CHOL/HDL Ratio: 4.4 Ratio (ref <=5.0)
Triglycerides: 222 mg/dL — ABNORMAL HIGH (ref <150)
VLDL: 44 mg/dL — ABNORMAL HIGH (ref <30)

## 2015-09-28 LAB — TSH: TSH: 1.818 u[IU]/mL (ref 0.350–4.500)

## 2015-09-28 NOTE — Addendum Note (Signed)
Addended by: Thurnell Garbe A on: 09/28/2015 12:10 PM   Modules accepted: Orders, Medications, SmartSet

## 2015-09-28 NOTE — Progress Notes (Signed)
Jamie Montes [birth year] [id number]   History:    60 y.o.  for annual gyn exam with no complaints today. Her PCP is Dr. Gwenlyn Perking and she does not recall was a last time he did her blood work. Patient was last seen in October 2015 for final postop visit. 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 Also she had excision of a right inguinal crease and mons pubis skin tags. Her preop pathology report from lesions was as follows:  Diagnosis 1. Vulva, biopsy, left labia majora BENIGN EPIDERMAL HYPERPLASIA, SEE DESCRIPTION 2. Vulva, biopsy, right labia majora BENIGN EPIDERMAL HYPERPLASIA, SEE DESCRIPTION 3. Perineum, biopsy, left side BENIGN EPIDERMAL HYPERPLASIA, SEE DESCRIPTION Microscopic Comment 1. -3. There is benign papillary epidermal hyperplasia with hyperkeratosis. There is a lymphocytic infiltrate. No diagnostic viral changes are observed. Considerations in this setting are reactive epidermal hyperplasia such as lichen simplex chronicus or evolving prurigo nodule, seborrheic keratosis, or non diagnostic condyloma. PAS stain is negative for fungal organism. There is no evidence of VIN or malignancy.  Lesions removed at time of surgery pathology report was as follows:  Diagnosis  1. Vulva, biopsy, mons pubis lesion  - MELANOCYTIC NEVUS, IRRITATED.  2. 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 Patient with past history of vitamin D deficiency currently not taking any calcium or vitamin D. She had her colonoscopy in 2012 benign polyps were removed. Her last Pap smear was in 2014 which was normal. Patient had a suboccipital craniectomy with cervical laminectomy by Dr.  Sherwood Gambler in 2013. Patient had a normal bone density study in 2015. Patient on no hormone replacement therapy reports no vaginal bleeding and recently started to become sexually active.  Past medical history,surgical history, family history and social history were all reviewed and documented in the EPIC chart.  Gynecologic History Patient's last menstrual period was 08/25/2011. Contraception: post menopausal status Last Pap: 2014. Results were: normal Last mammogram: 2016. Results were: We have no report the patient states that she was notified that it was benign  Obstetric History OB History  Gravida Para Term Preterm AB SAB TAB Ectopic Multiple Living  2 2        2     # Outcome Date GA Lbr Len/2nd Weight Sex Delivery Anes PTL Lv  2 Para      Vag-Spont     1 Para      Vag-Spont          ROS: A ROS was performed and pertinent positives and negatives are included in the history.  GENERAL: No fevers or chills. HEENT: No change in vision, no earache, sore throat or sinus congestion. NECK: No pain or stiffness. CARDIOVASCULAR: No chest pain or pressure. No palpitations. PULMONARY: No shortness of breath, cough or wheeze. GASTROINTESTINAL: No abdominal pain, nausea, vomiting or diarrhea, melena or bright red blood per rectum. GENITOURINARY: No urinary frequency, urgency, hesitancy or dysuria. MUSCULOSKELETAL: No joint or muscle pain, no back pain, no recent trauma. DERMATOLOGIC: No rash, no itching, no lesions. ENDOCRINE: No polyuria, polydipsia, no heat or cold intolerance. No recent change in weight. HEMATOLOGICAL: No anemia or easy bruising or bleeding. NEUROLOGIC: No headache, seizures, numbness, tingling or weakness. PSYCHIATRIC: No depression, no loss  of interest in normal activity or change in sleep pattern.     Exam: chaperone present  BP 128/86 mmHg  Ht 5\' 2"  (1.575 m)  Wt 229 lb (103.874 kg)  BMI 41.87 kg/m2  LMP 08/25/2011  Body mass index is 41.87 kg/(m^2).  General  appearance : Well developed well nourished female. No acute distress HEENT: Eyes: no retinal hemorrhage or exudates,  Neck supple, trachea midline, no carotid bruits, no thyroidmegaly Lungs: Clear to auscultation, no rhonchi or wheezes, or rib retractions  Heart: Regular rate and rhythm, no murmurs or gallops Breast:Examined in sitting and supine position were symmetrical in appearance, no palpable masses or tenderness,  no skin retraction, no nipple inversion, no nipple discharge, no skin discoloration, no axillary or supraclavicular lymphadenopathy Abdomen: no palpable masses or tenderness, no rebound or guarding Extremities: no edema or skin discoloration or tenderness  Pelvic:  Bartholin, Urethra, Skene Glands: Within normal limits             Vagina: No gross lesions or discharge  Cervix: No gross lesions or discharge  Uterus  anteverted, normal size, shape and consistency, non-tender and mobile  Adnexa  Without masses or tenderness  Anus and perineum  normal   Rectovaginal  normal sphincter tone without palpated masses or tenderness             Hemoccult : Colonoscopy this year    Assessment/Plan:  60 y.o. female for annual exam with past history of vulvar condyloma and cervical dysplasia. No lesions seen. Pap smear with high-risk HPV obtained today. Patient to schedule her bone density study in the next few weeks. The following screening blood work was ordered today: Comprehensive metabolic panel, fasting lipid profile, TSH, CBC, and urinalysis. Patient is up-to-date on her flu vaccine. Patient was reminded to schedule colonoscopy which is due this year.   Terrance Mass MD, 11:56 AM 09/28/2015

## 2015-09-29 LAB — URINALYSIS W MICROSCOPIC + REFLEX CULTURE
Bilirubin Urine: NEGATIVE
CASTS: NONE SEEN [LPF]
CRYSTALS: NONE SEEN [HPF]
GLUCOSE, UA: NEGATIVE
Ketones, ur: NEGATIVE
Leukocytes, UA: NEGATIVE
Nitrite: NEGATIVE
Specific Gravity, Urine: 1.027 (ref 1.001–1.035)
YEAST: NONE SEEN [HPF]
pH: 5.5 (ref 5.0–8.0)

## 2015-09-29 LAB — VITAMIN D 25 HYDROXY (VIT D DEFICIENCY, FRACTURES): Vit D, 25-Hydroxy: 22 ng/mL — ABNORMAL LOW (ref 30–100)

## 2015-09-30 LAB — URINE CULTURE

## 2015-10-02 ENCOUNTER — Telehealth: Payer: Self-pay | Admitting: Internal Medicine

## 2015-10-02 ENCOUNTER — Telehealth: Payer: Self-pay | Admitting: *Deleted

## 2015-10-02 DIAGNOSIS — E782 Mixed hyperlipidemia: Secondary | ICD-10-CM

## 2015-10-02 NOTE — Telephone Encounter (Signed)
Appointment with Dr.Paz @ 10:30am at 10/05/15 blanca will relay to patient.

## 2015-10-02 NOTE — Telephone Encounter (Signed)
Yes, please make an appointment.

## 2015-10-02 NOTE — Telephone Encounter (Signed)
Caller name: jennifer  Relation to pt: [place] Call back number: [phone] Pharmacy:  Reason for call:  Dr. Thamas Jaegers from [facility] referring patient to establish with Dr. Larose Kells, patient has elevated triglycerides. Please advise if patient can establish.

## 2015-10-02 NOTE — Telephone Encounter (Signed)
-----   Message from Ramond Craver, [place] sent at 10/01/2015 11:27 AM EST ----- Regarding: referral to pcp "Please also inform patient that her triglycerides continue to be elevated at 222 I would like to refer her to an internist for further consideration of treatment. Please cure Dr. Larose Kells and Dr. Gwenlyn Perking for referral to manage hyperlipidemia. She has no primary physician."  Anderson Malta, once you get appointment will you attach it to result note and get Earnest Bailey to call her. There are several other things in the result note Earnest Bailey needs to instruct her about. Thanks!!!!!!!

## 2015-10-02 NOTE — Telephone Encounter (Signed)
Patient scheduled for 10/05/2015

## 2015-10-03 ENCOUNTER — Other Ambulatory Visit: Payer: Self-pay | Admitting: Gynecology

## 2015-10-03 ENCOUNTER — Telehealth: Payer: Self-pay

## 2015-10-03 DIAGNOSIS — R3129 Other microscopic hematuria: Secondary | ICD-10-CM

## 2015-10-03 LAB — CYTOLOGY - PAP

## 2015-10-03 NOTE — Telephone Encounter (Signed)
Jamie Montes asked me to call in 800 Ibuprofen for this patient. Patient said she had asked you about it at visit. Ok?

## 2015-10-04 ENCOUNTER — Telehealth: Payer: Self-pay | Admitting: Behavioral Health

## 2015-10-04 ENCOUNTER — Other Ambulatory Visit: Payer: Self-pay | Admitting: Gynecology

## 2015-10-04 MED ORDER — IBUPROFEN 800 MG PO TABS: 800.0000 mg | ORAL_TABLET | Freq: Three times a day (TID) | ORAL | Status: DC | PRN

## 2015-10-04 NOTE — Telephone Encounter (Signed)
Unable to reach patient at time of Pre-Visit Call.  Left message for patient to return call when available.    

## 2015-10-04 NOTE — Telephone Encounter (Signed)
Pt daughter Murray Hodgkins called back for her. Pt speaks only spanish. Call Murray Hodgkins at [phone]

## 2015-10-04 NOTE — Telephone Encounter (Signed)
Rx sent. I let Earnest Bailey know.

## 2015-10-04 NOTE — Telephone Encounter (Signed)
Motrin 800 mg 1 PO q 8 hours PRN #30 refill x 3

## 2015-10-04 NOTE — Addendum Note (Signed)
Addended by: Kathlen Brunswick on: 10/04/2015 02:58 PM   Modules accepted: Orders, Medications

## 2015-10-04 NOTE — Telephone Encounter (Signed)
Pre-Visit Call completed with patient and chart updated.   Pre-Visit Info documented in Specialty Comments under SnapShot.    

## 2015-10-05 ENCOUNTER — Ambulatory Visit (INDEPENDENT_AMBULATORY_CARE_PROVIDER_SITE_OTHER): Payer: Managed Care, Other (non HMO) | Admitting: Internal Medicine

## 2015-10-05 ENCOUNTER — Encounter: Payer: Self-pay | Admitting: Internal Medicine

## 2015-10-05 VITALS — BP 124/78 | HR 54 | Temp 98.2°F | Ht 62.0 in | Wt 229.2 lb

## 2015-10-05 DIAGNOSIS — R002 Palpitations: Secondary | ICD-10-CM | POA: Diagnosis not present

## 2015-10-05 DIAGNOSIS — R079 Chest pain, unspecified: Secondary | ICD-10-CM | POA: Diagnosis not present

## 2015-10-05 DIAGNOSIS — I10 Essential (primary) hypertension: Secondary | ICD-10-CM | POA: Diagnosis not present

## 2015-10-05 DIAGNOSIS — Z7689 Persons encountering health services in other specified circumstances: Secondary | ICD-10-CM

## 2015-10-05 DIAGNOSIS — E785 Hyperlipidemia, unspecified: Secondary | ICD-10-CM

## 2015-10-05 NOTE — Patient Instructions (Signed)
BEFORE YOU LEAVE THE OFFICE: GO TO THE FRONT DESK Schedule labs to be done within few days (fasting)  Schedule a routine office visit or check up to be done in  6 months  Please be fasting    AFTER YOU LEAVE THE OFFICE: Will schedule a echocardiogram   SAQUE UNA CITA PARA 6 MESES, VENGA EN AYUNAS LE VAMOS A ORDENAR UN [place]

## 2015-10-05 NOTE — Progress Notes (Signed)
Subjective:    Patient ID: Jamie Montes, female    DOB: 10-06-1955, 60 y.o.   MRN: [id number]  DOS:  10/05/2015 Type of visit - description : new  patient, referred by gynecology due to high cholesterol Interval history:  Here for evaluation of slightly elevated cholesterol, labs reviewed. The patient also reports a long history of palpitations, on and off for years, not associated with syncope. Also reports she went to the ER 06-2015 with chest pain. At the time, eval was negative and she was recommended to follow-up by PCP. Reports no further chest pain.   Review of Systems  she is very active at work. Admits that her diet is not healthy. Admits to stress but denies depression or anxiety per se. No dyspnea on exertion or lower extremity edema.     Past Medical History  Diagnosis Date  . NSVD (normal spontaneous vaginal delivery)     X2  . Condyloma     VULVAR / PERINEAL  . Vitamin D deficiency   . Hypertension   . History of colonic polyps   . Anemia   . Arthritis   . Chiari malformation 2013    Dr. Sherwood Gambler    Past Surgical History  Procedure Laterality Date  . 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 Dilation and curettage of uterus      IST TRIMESTER SAB  . Resectoscopic polypectomy    . Lipoma excision      LEFT ARM/shoulder  . Suboccipital craniectomy cervical laminectomy  04/12/2012    Procedure: SUBOCCIPITAL CRANIECTOMY CERVICAL LAMINECTOMY/DURAPLASTY;  Surgeon: Hosie Spangle, MD;  Location: [facility];  Service: Neurosurgery;  Laterality: N/A;  suboccipital craniectomy with upper cervical laminectomy with duroplasty  . Co2 laser application N/A [id number]    Procedure: CO2 LASER VAPORIZATION OF VULVAR CONDYLOMA;  Surgeon: Terrance Mass, MD;  Location: [facility];  Service: Gynecology;  Laterality: N/A;  . Lesion removal N/A 05/05/2014    Procedure: EXCISION OF VULVAR/PERINEAL LESIONS;  Surgeon: Terrance Mass, MD;  Location: [facility];  Service: Gynecology;  Laterality: N/A;    Social History   Social History  . Marital Status: Single    Spouse Name: N/A  . Number of Children: 2  . Years of Education: N/A   Occupational History  . [employer]    Social History Main Topics  . Smoking status: Former Smoker    Quit date: 05/01/1998  . Smokeless tobacco: Never Used  . Alcohol Use: No  . Drug Use: No  . Sexual Activity:    Partners: Male    Birth Control/ Protection: Condom   Other Topics Concern  . Not on file   Social History Narrative   Original from [place], moved to [place] 1990s, moved to  [place] w/ boyfriend      Family History  Problem Relation Age of Onset  . Hypertension Mother   . Diabetes Mother   . Colon cancer Maternal Aunt 74    COLON  . Diabetes Father   . Stroke Mother   . CAD Neg Hx   . Breast cancer Neg Hx        Medication List       This list is accurate as of: 10/05/15  3:39 PM.  Always use your most recent med list.  hydrochlorothiazide 12.5 MG capsule  Commonly known as:  MICROZIDE  Take 12.5 mg by mouth daily.     ibuprofen 800 MG tablet  Commonly known as:  ADVIL,MOTRIN  Take 1 tablet (800 mg total) by mouth every 8 (eight) hours as needed.     metoprolol succinate 100 MG 24 hr tablet  Commonly known as:  TOPROL-XL  Take 100 mg by mouth daily. Take with or immediately following a meal.     zolpidem 10 MG tablet  Commonly known as:  AMBIEN  Take 1 tablet (10 mg total) by mouth at bedtime as needed for sleep.           Objective:   Physical Exam BP 124/78 mmHg  Pulse 54  Temp(Src) 98.2 F (36.8 C) (Oral)  Ht 5\' 2"  (1.575 m)  Wt 229 lb 4 oz (103.987 kg)  BMI 41.92 kg/m2  SpO2 97%  LMP 08/25/2011 General:   Well developed, well nourished . NAD.  HEENT:  Normocephalic . Face symmetric, atraumatic Lungs:  CTA B Normal respiratory effort, no intercostal retractions, no  accessory muscle use. Heart: RRR,  no murmur.  No pretibial edema bilaterally  Skin: Not pale. Not jaundice Neurologic:  alert & oriented X3.  Speech normal, gait appropriate for age and unassisted Psych--  Cognition and judgment appear intact.  Cooperative with normal attention span and concentration.  Behavior appropriate. No anxious or depressed appearing.      Assessment & Plan:   Assessment HTN Palpitations on and off Vitamin D deficiency  H/o insomnia (resolved as of 09-2015) H/o palpitations  H/o condyloma Abnormal PAPs Chiari Malformation, s/p surgery 013, Dr Rita Ohara  PLAN HTN: Seems well-controlled Dyslipidemia, labs reviewed: LDL 114, TG 222.  We had a long discussion about diet, she is very active at work.  . Palpitations: long h/o such, not previously evaluated, will recommend  a echocardiogram Chest pain, one time, went to the ER, eval negative. Recommend observation, to let me know if symptoms persist. RTC 6 months, fasting  Today, I spent more than  30  min with the patient: >50% of the time counseling regards diet, weight loss and dyslipidemia Also: reviewing the chart and labs ordered by other providers   Call daughter Jamie Montes for appointment  [phone]

## 2015-10-05 NOTE — Progress Notes (Signed)
Pre visit review using our clinic review tool, if applicable. No additional management support is needed unless otherwise documented below in the visit note. 

## 2015-10-09 ENCOUNTER — Other Ambulatory Visit: Payer: Self-pay

## 2015-10-10 ENCOUNTER — Other Ambulatory Visit: Payer: Self-pay | Admitting: Anesthesiology

## 2015-10-10 ENCOUNTER — Other Ambulatory Visit: Payer: Managed Care, Other (non HMO)

## 2015-10-10 DIAGNOSIS — R3129 Other microscopic hematuria: Secondary | ICD-10-CM

## 2015-10-10 MED ORDER — VITAMIN D (ERGOCALCIFEROL) 1.25 MG (50000 UNIT) PO CAPS: 50000.0000 [IU] | ORAL_CAPSULE | ORAL | Status: DC

## 2015-10-11 LAB — URINALYSIS W MICROSCOPIC + REFLEX CULTURE
BACTERIA UA: NONE SEEN [HPF]
Bilirubin Urine: NEGATIVE
CASTS: NONE SEEN [LPF]
GLUCOSE, UA: NEGATIVE
LEUKOCYTES UA: NEGATIVE
NITRITE: NEGATIVE
PH: 6 (ref 5.0–8.0)
Specific Gravity, Urine: 1.025 (ref 1.001–1.035)
Yeast: NONE SEEN [HPF]

## 2015-10-12 LAB — URINE CULTURE

## 2015-10-15 ENCOUNTER — Telehealth: Payer: Self-pay | Admitting: *Deleted

## 2015-10-15 NOTE — Telephone Encounter (Signed)
Notes faxed to [facility] they will fax me back with time and date.  

## 2015-10-15 NOTE — Telephone Encounter (Signed)
-----   Message from Terrance Mass, MD sent at 10/12/2015  3:06 PM EST ----- Please make an appointment for this patient with [facility] for this patient with persistent microscopic hematuria

## 2015-10-16 ENCOUNTER — Other Ambulatory Visit: Payer: Self-pay | Admitting: Gynecology

## 2015-10-16 ENCOUNTER — Ambulatory Visit (INDEPENDENT_AMBULATORY_CARE_PROVIDER_SITE_OTHER): Payer: Managed Care, Other (non HMO)

## 2015-10-16 DIAGNOSIS — Z13820 Encounter for screening for osteoporosis: Secondary | ICD-10-CM | POA: Diagnosis not present

## 2015-10-16 DIAGNOSIS — Z8639 Personal history of other endocrine, nutritional and metabolic disease: Secondary | ICD-10-CM

## 2015-10-16 DIAGNOSIS — Z780 Asymptomatic menopausal state: Secondary | ICD-10-CM

## 2015-10-17 ENCOUNTER — Ambulatory Visit (HOSPITAL_BASED_OUTPATIENT_CLINIC_OR_DEPARTMENT_OTHER): Payer: Managed Care, Other (non HMO)

## 2015-10-26 NOTE — Telephone Encounter (Signed)
Appointment on 11/26/15 @ 8:15 am with Dr.McKenzie claudia will relay to patient.

## 2016-01-04 ENCOUNTER — Other Ambulatory Visit: Payer: Self-pay | Admitting: Gynecology

## 2016-01-04 DIAGNOSIS — E559 Vitamin D deficiency, unspecified: Secondary | ICD-10-CM

## 2016-04-28 ENCOUNTER — Encounter (HOSPITAL_BASED_OUTPATIENT_CLINIC_OR_DEPARTMENT_OTHER): Payer: Self-pay | Admitting: Emergency Medicine

## 2016-04-28 ENCOUNTER — Emergency Department (HOSPITAL_BASED_OUTPATIENT_CLINIC_OR_DEPARTMENT_OTHER): Payer: Managed Care, Other (non HMO)

## 2016-04-28 ENCOUNTER — Emergency Department (HOSPITAL_BASED_OUTPATIENT_CLINIC_OR_DEPARTMENT_OTHER)
Admission: EM | Admit: 2016-04-28 | Discharge: 2016-04-28 | Disposition: A | Discharge status: Home / Self Care | Payer: Managed Care, Other (non HMO) | Attending: Emergency Medicine | Admitting: Emergency Medicine

## 2016-04-28 DIAGNOSIS — Z87891 Personal history of nicotine dependence: Secondary | ICD-10-CM | POA: Diagnosis not present

## 2016-04-28 DIAGNOSIS — R51 Headache: Secondary | ICD-10-CM | POA: Diagnosis not present

## 2016-04-28 DIAGNOSIS — I10 Essential (primary) hypertension: Secondary | ICD-10-CM | POA: Insufficient documentation

## 2016-04-28 DIAGNOSIS — Z79899 Other long term (current) drug therapy: Secondary | ICD-10-CM | POA: Diagnosis not present

## 2016-04-28 DIAGNOSIS — G4452 New daily persistent headache (NDPH): Secondary | ICD-10-CM

## 2016-04-28 NOTE — Discharge Instructions (Signed)
It is safe to take Tylenol or ibuprofen for your headache as needed.  CT scan of your brain was normal today. If your headaches persist, contact your primary care physician for a referral to a headache specialist

## 2016-04-28 NOTE — ED Notes (Signed)
Back from CT, alert, NAD, calm, interactive smiling. Family x3 at [facility]. Son leaving, daughter staying.

## 2016-04-28 NOTE — ED Notes (Signed)
To CT

## 2016-04-28 NOTE — ED Triage Notes (Signed)
HA off and on x 1 week, hxof ha

## 2016-04-28 NOTE — ED Notes (Addendum)
C/o intermittant HAs, fluctuates, comes and goes, ongoing for ~ 1 week, pinpoints to primarily L head, face, eye and ear, sometimes on the R and back of head. h/o similar, h/o of chiari malformation, with NSURG procedure/ surgery Dr. Sherwood Gambler ~ 4 yrs ago, took prescribed ibuprofen 800mg  at 1500 (denies: fever, nv, dizziness, congestion, tenderness, visual changes, weakness, numbness/ tingling). Pt alert, NAD, calm, interactive, resps e/u, speaking clearly, no dyspnea noted. MAEx4, no droop or drift, PERRL.

## 2016-04-28 NOTE — ED Provider Notes (Signed)
[facility] Provider Note   CSN: [id number] Arrival date & time: 04/28/16  1735  First Provider Contact:  First MD Initiated Contact with Patient 04/28/16 2206     By signing my name below, I, Soijett Blue, attest that this documentation has been prepared under the direction and in the presence of Orlie Dakin, MD. Electronically Signed: Soijett Blue, ED Scribe. 04/28/16. 10:24 PM.  History is obtained from professional medical interpreter. Patient speaks no [place]  History   Chief Complaint Chief Complaint  Patient presents with  . Headache    HPI Jamie Montes is a 60 y.o. female with a medical hx of HTN who presents to the Emergency Department complaining of intermittent frontal HA onset 1 weekAgo, gradually.. Pt notes that 4 days ago her frontal HA radiated to her left eye. Pt reports that her HA is gradually worsened over time. Pt states that her HA resolved several hours ago and she denies having a HA at this time. Pt states that she was seen by a provider 2 days ago and was given ibuprofen for her symptoms with mild relief of her symptoms. Denies worsening or alleviating factors for her HA. No fever no visual changes She notes that she has tried ibuprofen with her last dose being today with mild relief of her symptoms. Treated with ibuprofen with partial relief. Presently asymptomatic. No other associated symptoms She denies vision change and any other symptoms. Pt notes that she does not smoke cigarettes or use ETOH, or illegal drug use. Pt PCP is at [facility], [place].    Per pt chart review: Pt was seen at [facility] on 04/26/2016 for migraine. Pt was given toradol for her symptoms and advised to use 800 mg ibuprofen PRN.    The history is provided by the patient. A language interpreter was used ([place]).    Past Medical History:  Diagnosis Date  . Anemia   . Arthritis   . Chiari malformation 2013   Dr. Sherwood Gambler  .  Condyloma    VULVAR / PERINEAL  . History of colonic polyps   . Hypertension   . NSVD (normal spontaneous vaginal delivery)    X2  . Vitamin D deficiency     Patient Active Problem List   Diagnosis Date Noted  . Insomnia 05/16/2014  . Vulvar lesion 04/17/2014  . CIN II (cervical intraepithelial neoplasia II) 07/09/2012  . HTN (hypertension) 07/09/2012  . Symptoms, such as flushing, sleeplessness, headache, lack of concentration, associated with the menopause 07/09/2012  . Colon polyps 07/09/2012  . Family history of diabetes mellitus 07/09/2012  . 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 Vitamin D deficiency 07/01/2011  . Weight gain 07/01/2011  . Condyloma acuminatum in female 07/01/2011    Past Surgical History:  Procedure Laterality Date  . CERVICAL BIOPSY  W/ LOOP ELECTRODE EXCISION  2001   CIN 2 , MARGINS FREE  . CO2 LASER APPLICATION N/A [id number]   Procedure: CO2 LASER VAPORIZATION OF VULVAR CONDYLOMA;  Surgeon: Terrance Mass, MD;  Location: [facility];  Service: Gynecology;  Laterality: N/A;  . DILATION AND CURETTAGE OF UTERUS     IST TRIMESTER SAB  . LESION REMOVAL N/A 05/05/2014   Procedure: EXCISION OF VULVAR/PERINEAL LESIONS;  Surgeon: Terrance Mass, MD;  Location: [facility];  Service: Gynecology;  Laterality: N/A;  . LIPOMA EXCISION     LEFT ARM/shoulder  . RESECTOSCOPIC POLYPECTOMY    . SUBOCCIPITAL CRANIECTOMY CERVICAL LAMINECTOMY  04/12/2012  Procedure: SUBOCCIPITAL CRANIECTOMY CERVICAL LAMINECTOMY/DURAPLASTY;  Surgeon: Hosie Spangle, MD;  Location: [facility];  Service: Neurosurgery;  Laterality: N/A;  suboccipital craniectomy with upper cervical laminectomy with duroplasty    OB History    Gravida Para Term Preterm AB Living   2 2       2    SAB TAB Ectopic Multiple Live Births                   Home Medications    Prior to Admission medications   Medication Sig Start Date End Date Taking? Authorizing Provider  hydrochlorothiazide (MICROZIDE) 12.5 MG  capsule Take 12.5 mg by mouth daily.    Historical Provider, MD  ibuprofen (ADVIL,MOTRIN) 800 MG tablet Take 1 tablet (800 mg total) by mouth every 8 (eight) hours as needed. Patient not taking: Reported on 10/05/2015 10/04/15   Terrance Mass, MD  metoprolol succinate (TOPROL-XL) 100 MG 24 hr tablet Take 100 mg by mouth daily. Take with or immediately following a meal.    Historical Provider, MD  Vitamin D, Ergocalciferol, (DRISDOL) 50000 units CAPS capsule Take 1 capsule (50,000 Units total) by mouth every 7 (seven) days. 10/10/15   Terrance Mass, MD  zolpidem (AMBIEN) 10 MG tablet Take 1 tablet (10 mg total) by mouth at bedtime as needed for sleep. Patient not taking: Reported on 10/05/2015 05/16/14 06/15/14  Terrance Mass, MD    Family History Family History  Problem Relation Age of Onset  . Hypertension Mother   . Diabetes Mother   . Stroke Mother   . Colon cancer Maternal Aunt 74    COLON  . Diabetes Father   . CAD Neg Hx   . Breast cancer Neg Hx     Social History Social History  Substance Use Topics  . Smoking status: Former Smoker    Quit date: 05/01/1998  . Smokeless tobacco: Never Used  . Alcohol use No     Allergies   Review of patient's allergies indicates no known allergies.   Review of Systems Review of Systems  Constitutional: Negative.   Respiratory: Negative.   Cardiovascular: Negative.   Gastrointestinal: Negative.   Musculoskeletal: Negative.   Skin: Negative.   Neurological: Positive for headaches.  Psychiatric/Behavioral: Negative.   All other systems reviewed and are negative.    Physical Exam Updated Vital Signs BP 135/66   Pulse (!) 52   Temp 98.3 F (36.8 C) (Oral)   Resp 18   Ht 5\' 5"  (1.651 m)   Wt 230 lb (104.3 kg)   LMP 09/13/2011   SpO2 98%   BMI 38.27 kg/m   Physical Exam  Constitutional: She is oriented to person, place, and time. She appears well-developed and well-nourished.  HENT:  Head: Normocephalic and  atraumatic.  Eyes: Conjunctivae are normal. Pupils are equal, round, and reactive to light.  Neck: Neck supple. No tracheal deviation present. No thyromegaly present.  Cardiovascular: Regular rhythm.   No murmur heard. Pulmonary/Chest: Effort normal and breath sounds normal.  Abdominal: Soft. Bowel sounds are normal. She exhibits no distension. There is no tenderness.  Obese  Musculoskeletal: Normal range of motion. She exhibits no edema or tenderness.  Neurological: She is alert and oriented to person, place, and time. She has normal reflexes. She displays normal reflexes. No cranial nerve deficit. She exhibits normal muscle tone. Coordination normal.  Gait normal Romberg normal pronator drift normal DTR symmetric bilaterally at knee jerk ankle jerk and biceps toes downward going  bilaterally. Motor strength 5 over 5 overall  Skin: Skin is warm and dry. No rash noted.  Psychiatric: She has a normal mood and affect.  Nursing note and vitals reviewed.    ED Treatments / Results  DIAGNOSTIC STUDIES: Oxygen Saturation is 98% on RA, nl by my interpretation.    COORDINATION OF CARE: 10:24 PM Discussed treatment plan with pt at bedside which includes CT head and use tylenol or ibuprofen PRN and pt agreed to plan  Radiology Ct Head Wo Contrast  Result Date: 04/28/2016 CLINICAL DATA:  Intermittent headaches for the past week. EXAM: CT HEAD WITHOUT CONTRAST TECHNIQUE: Contiguous axial images were obtained from the base of the skull through the vertex without intravenous contrast. COMPARISON:  03/24/2015 FINDINGS: The ventricles are normal in size and configuration. No extra-axial fluid collections are identified. The gray-white differentiation is normal. No CT findings for acute intracranial process such as hemorrhage or infarction. No mass lesions. The brainstem and cerebellum are grossly normal. The bony structures are intact. The paranasal sinuses and mastoid air cells are clear. The globes are  intact. IMPRESSION: Normal head CT.  No change since prior examination. Electronically Signed   By: Marijo Sanes M.D.   On: 04/28/2016 21:41    Procedures Procedures (including critical care time)  Medications Ordered in ED Medications - No data to display Results for orders placed or performed in visit on 10/10/15  Urine culture  Result Value Ref Range   Colony Count 2,000 COLONIES/ML    Organism ID, Bacteria Insignificant Growth   Urinalysis with Culture Reflex  Result Value Ref Range   Color, Urine DARK YELLOW YELLOW   APPearance TURBID (A) CLEAR   Specific Gravity, Urine 1.025 1.001 - 1.035   pH 6.0 5.0 - 8.0   Glucose, UA NEGATIVE NEGATIVE   Bilirubin Urine NEGATIVE NEGATIVE   Ketones, ur TRACE (A) NEGATIVE   Hgb urine dipstick 2+ (A) NEGATIVE   Protein, ur 2+ (A) NEGATIVE   Nitrite NEGATIVE NEGATIVE   Leukocytes, UA NEGATIVE NEGATIVE   WBC, UA 0-5 <=5 WBC/HPF   RBC / HPF 10-20 (A) <=2 RBC/HPF   Squamous Epithelial / LPF 6-10 (A) <=5 HPF   Bacteria, UA NONE SEEN NONE SEEN HPF   Crystals See Below (A) NONE SEEN HPF   Casts NONE SEEN NONE SEEN LPF   Yeast NONE SEEN NONE SEEN HPF   Ct Head Wo Contrast  Result Date: 04/28/2016 CLINICAL DATA:  Intermittent headaches for the past week. EXAM: CT HEAD WITHOUT CONTRAST TECHNIQUE: Contiguous axial images were obtained from the base of the skull through the vertex without intravenous contrast. COMPARISON:  03/24/2015 FINDINGS: The ventricles are normal in size and configuration. No extra-axial fluid collections are identified. The gray-white differentiation is normal. No CT findings for acute intracranial process such as hemorrhage or infarction. No mass lesions. The brainstem and cerebellum are grossly normal. The bony structures are intact. The paranasal sinuses and mastoid air cells are clear. The globes are intact. IMPRESSION: Normal head CT.  No change since prior examination. Electronically Signed   By: Marijo Sanes M.D.   On:  04/28/2016 21:41    Initial Impression / Assessment and Plan / ED Course  I have reviewed the triage vital signs and the nursing notes.  Pertinent imaging results that were available during my care of the patient were reviewed by me and considered in my medical decision making (see chart for details).  Clinical Course    Headache felt to  be nonspecific. Suggest Tylenol or Advil for pain. Referral back to primary care physician for referral to a headache specialist if symptoms continue  Final Clinical Impressions(s) / ED Diagnoses  Diagnosis nonspecific headache Final diagnoses:  None    New Prescriptions New Prescriptions   No medications on file     I personally performed the services described in this documentation, which was scribed in my presence. The recorded information has been reviewed and considered.    Orlie Dakin, MD 04/28/16 2232

## 2016-09-29 ENCOUNTER — Ambulatory Visit (INDEPENDENT_AMBULATORY_CARE_PROVIDER_SITE_OTHER): Payer: Managed Care, Other (non HMO) | Admitting: Gynecology

## 2016-09-29 ENCOUNTER — Encounter: Payer: Self-pay | Admitting: Gynecology

## 2016-09-29 VITALS — BP 134/80 | Ht 62.5 in | Wt 234.0 lb

## 2016-09-29 DIAGNOSIS — Z86010 Personal history of colonic polyps: Secondary | ICD-10-CM

## 2016-09-29 DIAGNOSIS — Z01411 Encounter for gynecological examination (general) (routine) with abnormal findings: Secondary | ICD-10-CM | POA: Diagnosis not present

## 2016-09-29 DIAGNOSIS — Z8639 Personal history of other endocrine, nutritional and metabolic disease: Secondary | ICD-10-CM

## 2016-09-29 DIAGNOSIS — Z87410 Personal history of cervical dysplasia: Secondary | ICD-10-CM

## 2016-09-29 NOTE — Progress Notes (Signed)
Jamie Montes [birth year] [id number]   History:    61 y.o.  for annual gyn exam with no complaints today. Patient's PCP is Dr. Gwenlyn Perking who is been doing her blood work. Patient's past GYN history as follows:  On 05/05/2014 patient with CO2 laser vaporization of vulvar condyloma lesions and hyperplastic lesions. Also she had excision of a right inguinal crease and mons pubis skin tags. Her preop pathology report from lesions was as follows:  Diagnosis 1. Vulva, biopsy, left labia majora BENIGN EPIDERMAL HYPERPLASIA, SEE DESCRIPTION 2. Vulva, biopsy, right labia majora BENIGN EPIDERMAL HYPERPLASIA, SEE DESCRIPTION 3. Perineum, biopsy, left side BENIGN EPIDERMAL HYPERPLASIA, SEE DESCRIPTION Microscopic Comment 1. -3. There is benign papillary epidermal hyperplasia with hyperkeratosis. There is a lymphocytic infiltrate. No diagnostic viral changes are observed. Considerations in this setting are reactive epidermal hyperplasia such as lichen simplex chronicus or evolving prurigo nodule, seborrheic keratosis, or non diagnostic condyloma. PAS stain is negative for fungal organism. There is no evidence of VIN or malignancy.  Lesions removed at time of surgery pathology report was as follows:  Diagnosis  1. Vulva, biopsy, mons pubis lesion  - MELANOCYTIC NEVUS, IRRITATED.  2. 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 Patient with past history of vitamin D deficiency currently vitamin D 2000 units daily. She had her colonoscopy in 2012 benign polyps were removed she states she had a follow-up colonoscopy in 2017 which been benign. Her last Pap smear was in 2017 which was normal. Patient had a suboccipital craniectomy with cervical laminectomy by Dr. Sherwood Gambler in  2013. Patient had a normal bone density study in 2015 and 2017 Patient on no hormone replacement therapy reports no vaginal bleeding and recently started to become sexually active. Patient with no vasomotor symptoms.  Past medical history,surgical history, family history and social history were all reviewed and documented in the EPIC chart.  Gynecologic History Patient's last menstrual period was 09/13/2011. Contraception: post menopausal status Last Pap: 2017. Results were: normal Last mammogram: 2017. Results were: normal  Obstetric History OB History  Gravida Para Term Preterm AB Living  2 2       2   SAB TAB Ectopic Multiple Live Births               # Outcome Date GA Lbr Len/2nd Weight Sex Delivery Anes PTL Lv  2 Para      Vag-Spont     1 Para      Vag-Spont          ROS: A ROS was performed and pertinent positives and negatives are included in the history.  GENERAL: No fevers or chills. HEENT: No change in vision, no earache, sore throat or sinus congestion. NECK: No pain or stiffness. CARDIOVASCULAR: No chest pain or pressure. No palpitations. PULMONARY: No shortness of breath, cough or wheeze. GASTROINTESTINAL: No abdominal pain, nausea, vomiting or diarrhea, melena or bright red blood per rectum. GENITOURINARY: No urinary frequency, urgency, hesitancy or dysuria. MUSCULOSKELETAL: No joint or muscle pain, no back pain, no recent trauma. DERMATOLOGIC: No rash, no itching, no lesions. ENDOCRINE: No polyuria, polydipsia, no heat or cold intolerance. No recent change in weight. HEMATOLOGICAL: No anemia or easy bruising or bleeding. NEUROLOGIC: No headache, seizures, numbness, tingling or weakness. PSYCHIATRIC: No depression, no loss of  interest in normal activity or change in sleep pattern.     Exam: chaperone present  BP 134/80   Ht 5' 2.5" (1.588 m)   Wt 234 lb (106.1 kg)   LMP 09/13/2011   BMI 42.12 kg/m   Body mass index is 42.12 kg/m.  General appearance : Well  developed well nourished female. No acute distress HEENT: Eyes: no retinal hemorrhage or exudates,  Neck supple, trachea midline, no carotid bruits, no thyroidmegaly Lungs: Clear to auscultation, no rhonchi or wheezes, or rib retractions  Heart: Regular rate and rhythm, no murmurs or gallops Breast:Examined in sitting and supine position were symmetrical in appearance, no palpable masses or tenderness,  no skin retraction, no nipple inversion, no nipple discharge, no skin discoloration, no axillary or supraclavicular lymphadenopathy Abdomen: no palpable masses or tenderness, no rebound or guarding Extremities: no edema or skin discoloration or tenderness  Pelvic:  Bartholin, Urethra, Skene Glands: Within normal limits             Vagina: No gross lesions or discharge  Cervix: No gross lesions or discharge  Uterus  anteverted, normal size, shape and consistency, non-tender and mobile  Adnexa  Without masses or tenderness  Anus and perineum  normal   Rectovaginal  normal sphincter tone without palpated masses or tenderness             Hemoccult colonoscopy normal less than 12 months ago     Assessment/Plan:  61 y.o. female for annual exam postmenopausal on no hormone replacement therapy doing well. Past history of vitamin D deficiency will check a vitamin D level today and encourage her to take vitamin D 2000 units daily. She is due to see her PCP sometime next month will be doing her complete blood work in a fasting state. Pap smear not indicated this year. She was reminded to schedule her mammogram for later this year. Pap smear was done today. Because of her history of CIN-2 I have recommended that she have a Pap smear every year instead of the guidelines recommendation every 3 years for close surveillance. She was provided with fecal Hemoccult cards to submit to the office for testing once a year.   Terrance Mass MD, 11:20 AM 09/29/2016

## 2016-09-30 ENCOUNTER — Other Ambulatory Visit: Payer: Self-pay | Admitting: Anesthesiology

## 2016-09-30 ENCOUNTER — Other Ambulatory Visit: Payer: Self-pay | Admitting: Gynecology

## 2016-09-30 DIAGNOSIS — E559 Vitamin D deficiency, unspecified: Secondary | ICD-10-CM

## 2016-09-30 LAB — PAP IG W/ RFLX HPV ASCU

## 2016-09-30 LAB — VITAMIN D 25 HYDROXY (VIT D DEFICIENCY, FRACTURES): VIT D 25 HYDROXY: 29 ng/mL — AB (ref 30–100)

## 2016-09-30 MED ORDER — VITAMIN D (ERGOCALCIFEROL) 1.25 MG (50000 UNIT) PO CAPS: 50000.0000 [IU] | ORAL_CAPSULE | ORAL | 0 refills | Status: DC

## 2016-09-30 MED ORDER — IBUPROFEN 800 MG PO TABS: 800.0000 mg | ORAL_TABLET | Freq: Three times a day (TID) | ORAL | 3 refills | Status: DC | PRN

## 2016-10-23 ENCOUNTER — Other Ambulatory Visit: Payer: Self-pay | Admitting: Anesthesiology

## 2016-10-23 DIAGNOSIS — Z1211 Encounter for screening for malignant neoplasm of colon: Secondary | ICD-10-CM

## 2016-11-03 ENCOUNTER — Encounter: Payer: Self-pay | Admitting: Anesthesiology

## 2016-12-23 ENCOUNTER — Other Ambulatory Visit: Payer: Self-pay | Admitting: Gynecology

## 2017-02-04 ENCOUNTER — Encounter: Payer: Self-pay | Admitting: Gynecology

## 2017-09-30 ENCOUNTER — Encounter: Payer: Managed Care, Other (non HMO) | Admitting: Obstetrics & Gynecology

## 2017-11-18 ENCOUNTER — Encounter: Payer: Self-pay | Admitting: Obstetrics & Gynecology

## 2017-11-18 ENCOUNTER — Ambulatory Visit (INDEPENDENT_AMBULATORY_CARE_PROVIDER_SITE_OTHER): Payer: Managed Care, Other (non HMO) | Admitting: Obstetrics & Gynecology

## 2017-11-18 VITALS — BP 140/88 | Ht 62.0 in | Wt 244.0 lb

## 2017-11-18 DIAGNOSIS — Z01419 Encounter for gynecological examination (general) (routine) without abnormal findings: Secondary | ICD-10-CM | POA: Diagnosis not present

## 2017-11-18 DIAGNOSIS — Z780 Asymptomatic menopausal state: Secondary | ICD-10-CM | POA: Diagnosis not present

## 2017-11-18 DIAGNOSIS — Z1151 Encounter for screening for human papillomavirus (HPV): Secondary | ICD-10-CM

## 2017-11-18 DIAGNOSIS — N871 Moderate cervical dysplasia: Secondary | ICD-10-CM

## 2017-11-18 NOTE — Progress Notes (Signed)
Jamie Montes Monday 01/31/1956 [id number]   History:    62 y.o. G2P2L2 Single.  2 grand-children.  RP:  Established patient presenting for annual gyn exam   HPI: Menopause, well on no HRT.  No pelvic pain.  H/O CIN 2, LEEP with negative margins in 2001.  Pap negative 09/2016.  H/O CO2 Laser of vulvar condylomas.  Abstinent x >1 year.  No pelvic pain.  Urine/BMs wnl.  Breasts normal.  BMI 44.63.  Health labs with Fam MD.    Past medical history,surgical history, family history and social history were all reviewed and documented in the EPIC chart.  Gynecologic History Patient's last menstrual period was 09/13/2011. Contraception: abstinence and post menopausal status Last Pap: 09/2016. Results were: Negative Last mammogram: 2016. Results were: normal per patient.  Will schedule now. Bone Density: 09/2015 Normal, will repeat at 3 years. Colonoscopy: 2012   Obstetric History OB History  Gravida Para Term Preterm AB Living  2 2       2   SAB TAB Ectopic Multiple Live Births               # Outcome Date GA Lbr Len/2nd Weight Sex Delivery Anes PTL Lv  2 Para      Vag-Spont     1 Para      Vag-Spont          ROS: A ROS was performed and pertinent positives and negatives are included in the history.  GENERAL: No fevers or chills. HEENT: No change in vision, no earache, sore throat or sinus congestion. NECK: No pain or stiffness. CARDIOVASCULAR: No chest pain or pressure. No palpitations. PULMONARY: No shortness of breath, cough or wheeze. GASTROINTESTINAL: No abdominal pain, nausea, vomiting or diarrhea, melena or bright red blood per rectum. GENITOURINARY: No urinary frequency, urgency, hesitancy or dysuria. MUSCULOSKELETAL: No joint or muscle pain, no back pain, no recent trauma. DERMATOLOGIC: No rash, no itching, no lesions. ENDOCRINE: No polyuria, polydipsia, no heat or cold intolerance. No recent change in weight. HEMATOLOGICAL: No anemia or easy bruising or bleeding. NEUROLOGIC: No  headache, seizures, numbness, tingling or weakness. PSYCHIATRIC: No depression, no loss of interest in normal activity or change in sleep pattern.     Exam:   Ht 5\' 2"  (1.575 m)   Wt 244 lb (110.7 kg)   LMP 09/13/2011   BMI 44.63 kg/m   Body mass index is 44.63 kg/m.  General appearance : Well developed well nourished female. No acute distress HEENT: Eyes: no retinal hemorrhage or exudates,  Neck supple, trachea midline, no carotid bruits, no thyroidmegaly Lungs: Clear to auscultation, no rhonchi or wheezes, or rib retractions  Heart: Regular rate and rhythm, no murmurs or gallops Breast:Examined in sitting and supine position were symmetrical in appearance, no palpable masses or tenderness,  no skin retraction, no nipple inversion, no nipple discharge, no skin discoloration, no axillary or supraclavicular lymphadenopathy Abdomen: no palpable masses or tenderness, no rebound or guarding Extremities: no edema or skin discoloration or tenderness  Pelvic: Vulva: Normal             Vagina: No gross lesions or discharge  Cervix: No gross lesions or discharge.  Pap/HR HPV done.  Uterus  AV, normal size, shape and consistency, non-tender and mobile  Adnexa  Without masses or tenderness  Anus: Normal   Assessment/Plan:  62 y.o. female for annual exam   1. Encounter for routine gynecological examination with Papanicolaou smear of cervix Normal gynecologic exam.  Pap  with high risk HPV done today.  Breast exam normal.  Last screening mammogram in 2016.  Patient will schedule screening mammogram now.  Health labs with family physician.  Last colonoscopy in 2012.  2. Menopause present Well on no hormone replacement therapy.  No postmenopausal bleeding.  Vitamin D supplements, calcium rich nutrition and weightbearing physical activity recommended.  Last bone density in January 2017 was completely normal.  Will repeat at 3 years.  3. Dysplasia of cervix, high grade CIN 2 Pap with high-risk  HPV done today.  Princess Bruins MD, 10:26 AM 11/18/2017

## 2017-11-19 ENCOUNTER — Encounter: Payer: Self-pay | Admitting: Obstetrics & Gynecology

## 2017-11-19 NOTE — Patient Instructions (Signed)
1. Encounter for routine gynecological examination with Papanicolaou smear of cervix Normal gynecologic exam.  Pap with high risk HPV done today.  Breast exam normal.  Last screening mammogram in 2016.  Patient will schedule screening mammogram now.  Health labs with family physician.  Last colonoscopy in 2012.  2. Menopause present Well on no hormone replacement therapy.  No postmenopausal bleeding.  Vitamin D supplements, calcium rich nutrition and weightbearing physical activity recommended.  Last bone density in January 2017 was completely normal.  Will repeat at 3 years.  3. Dysplasia of cervix, high grade CIN 2 Pap with high-risk HPV done today.  [place]enton Brick, fue un placer conocerle hoy!  Voy a informarle de sus [employer].   Mantenimiento de la salud de las mujeres posmenopusicas (Health Maintenance for Postmenopausal Women) La menopausia es un proceso normal en el cual se pierde la capacidad reproductiva. Este proceso ocurre gradualmente a lo largo de un perodo de meses o aos, por lo general entre los 21 y los 55aos. La menopausia es completa cuando no se han tenido 62mnstruaciones consecutivas. Es importante hablar con el mdico sobre algunas de las enfermedades ms comunes que afectan a las mujeres posmenopusicas, como la cardiopata coronaria, el cncer y la prdida de la masa sea (osteoporosis). Adoptar un estilo de vida saludable y recibir atencin preventiva pueden ayudar a promover la salud y eMusician Adems, estas medidas pueden reducir las probabilidades de desarrollar algunas de estas enfermedades frecuentes. QU DEBO SABER ACERCA DE [place]edar [place] Durante le m[place] puede tener una serie de sntomas, por ejemplo:  Calores repentinos moderados a graves.  Sudoracin nocturna.  Disminucin del deseo sexual.  Cambios en el estado de nimo.  Dolores de c[place]  Cansancio.  Irritabilidad.  Problemas de memoria.  Insomnio. Tratar o no los  cambios que ocurren en la menopausia es una decisin personal que se toma con el mdico. QU DEBO SABER SOBRE LOS TRATAMIENTOS DE REPOSICIN HORMONAL Y LOS SUPLEMENTOS? Los productos para la terapia hormonal son eficaces para tratar los sntomas que se asocian con la menopausia, como los calores repentinos y las sudoraciones nocturnas. La reposicin hormonal conlleva ciertos riesgos, especialmente a medida que una mujer envejece. Si est pensando en usar tratamientos con estrgeno o estrgeno con progesterona, analice los beneficios y los riesgos con el mdico. QU DEBO SABER SClevelandLMiddleton A medida que una persona envejece, aumenta la probabilidad de tener cardiopata coronaria, infarto de miocardio e ictus. Esto puede deberse, en parte, a los cambios hormonales que atraviesa el cuerpo durante la menopausia. Estos cambios pueden afectar la forma en que el organismo procesa las gMechanicstown los triglicridos y el colesterol de su dieta. El infarto de miocardio y el ictus son emergencias mdicas. Hay muchas cosas que se pueden hacer para ayudar a prevenir la cardiopata coronaria y el ictus:  Debe controlar su presin arterial al menos cada uno o dOr[place] City La hipertensin arterial causa enfermedades cardacas y aSerbiael riesgo de ictus.  Si tiene entre 552y 79aos, consulte al mdico si debe tomar aspirina para prevenir un infarto de miocardio o un ictus.  No consuma ningn producto que contenga tabaco, lo que incluye cigarrillos, tabaco de mHigher education careers advisero cPsychologist, sport and exercise Si necesita ayuda para dejar de fumar, consulte al mMeadWestvaco  Es importante seguir una dieta sana y mTheatre managerun peso saludable. ? Asegrese de iFamily Dollar Storesverduras, frutas, productos lcteos de bajo contenido de gDjiboutiy pAdvertising account planner ? No consuma alimentos con  alto contenido de grasas slidas, azcares agregados o sal (sodio).  Realice actividad fsica con regularidad. Esta es una de las cosas ms  importantes que puede hacer por su salud. ? Intente realizar al menos 131mnutos de actividad fsica por semana. El tipo de ejercicio que realice debe aumentar la frecuencia cardaca y hacerla sudar. Esto se conoce como ejercicio de iMal[place] ? Intente hacer ejercicios de elongacin por lo menos dos veces por semana. Agrguelos al plan de ejercicio de intensidad moderada.  Conozca sus cifras. Pdale al mdico que le controle el colesterol y el nivel sanguneo de glucosa. Siga hacindose anlisis de sAmerican Electric Powerse lo haya indicado el mdico. QU DEBO SABER SOBRE LAS PRUEBAS DE DETECCIN DEL CNCER? Hay varios tipos de cncer. Tome las siguientes medidas para reducir el riesgo y dActuaryformacin cancerosa lo antes posible. Cncer de mama  Practique la autoconciencia de la mama. ? Esto significa reconocer la apariencia normal de sus mamas y cmo las siente. ? Tambin significa realizar autoexmenes regulares de las mKawela Bay Informe a su mdico sobre cualquier cambio, sin importar cun pequeo sea.  Si es mayor de 40aos, visite a un mdico para qPublic librarianun examen de las mamas (exploracin clnica mamaria o ECM) todos los a[place] En funcin de sToysRus los antecedentes familiares y la historia cRyegate tal vez sea recomendable que tambin se haga una radiografa anual de las mamas ([place].  Si tiene antecedentes familiares de cncer de mama, hable con el mdico para someterse a un estudio gentico.  Si tiene alto riesgo de pChief Financial Officerde mama, hable con el mdico para hacerse a uPublic house manager(RM) y uLavinia Sharpstodos los aRedby  La evaluacin del gen del cncer de mama (BRCA) se recomienda a las mujeres que tengan familiares con tumores malignos relacionados con el BRCA. Los resultados de la evaluacin determinarn la necesidad de recibir asesoramiento gentico y pBuilding services engineerde deteccin del BRCA1 y el BRCA2. Los tumores malignos relacionados con el BRCA  incluyen estos tipos: ? Mama. Este tipo se presenta en hombres o mujeres. ? Ovario. ? Trompas. A este tipo tambin se lo llama cncer de trompa de Falopio. ? Cncer de la pared abdominal o plvica (cncer de peritoneo). ? Prstata. ? Pncreas. Cncer de cuello uterino, de tero y de ovario El mdico puede recomendarle que se haga pruebas peridicas de deteccin de cncer de los rganos de la pelvis, los cuales iVerizonovarios, el tero y la vagina. Estas pruebas incluyen un examen plvico, que abarca controlar si se produjeron cambios microscpicos en la superficie del cuello del tero (prueba de Papanicolaou).  A las mujeres que tCircuit City21 y 664aos los mdicos pueden recomendarles que se realicen un examen plvico y uArdelia Memsprueba de Papanicolaou cada tres aos. A las mujeres que tienen entre 30 y 65aos, pueden recomendarles la prueba de Papanicolaou y el examen plvico, en combinacin con una prueba de deteccin del virus del papiloma humano (VPH) cCitrus Algunos tipos de VPH aumentan el riesgo de pChief Financial Officerde cuello del tero. La prueba para la deteccin del VPH tambin puede realizarse a mujeres de cualquier edad cuyos resultados de la prueba de Papanicolaou no sean claros.  Es posible que otros mdicos no recomienden exmenes de deteccin a las mujeres no embarazadas que se consideran sujetos de bajo riesgo de pChief Financial Officerde pelvis y no tienen sntomas. Pregntele al mdico si un examen plvico de deteccin es adecuado para usted.  Si  ha recibido un tratamiento para Science writer cervical o una enfermedad que podra causar cncer, necesitar realizarse una prueba de Papanicolaou y controles durante al menos 80 aos de concluido el [place]. Si no se ha hecho el Papanicolaou con regularidad, debern volver a evaluarse los factores de riesgo (como tener un nuevo compaero sexual), para Teacher, adult education si debe empezar a Dispensing optician los estudios nuevamente. Algunas mujeres sufren  problemas mdicos que aumentan la probabilidad de Museum/gallery curator cncer de cuello del tero. En estos casos, el mdico podr [employer] se realicen controles y pruebas de Papanicolaou con ms frecuencia.  Si tiene antecedentes familiares de cncer de tero o de ovario, hable con el mdico para someterse a un estudio gentico.  Si tiene hemorragia vaginal despus de la menopausia, informe al mdico.  En la actualidad, no hay pruebas confiables para la deteccin del cncer de ovario. Cncer de pulmn Se recomienda realizar exmenes de deteccin de cncer de pulmn a personas adultas entre 50 y 99 aos que estn en riesgo de Horticulturist, commercial de pulmn por sus antecedentes de consumo de tabaco. Se recomienda una tomografa computarizada (TC) de baja dosis de los pulmones todos los aos si usted:  Fuma actualmente.  Ha fumado durante 30aos un paquete diario y sigue fumando o dej el hbito en algn momento en los ltimos 15aos. Un paquete-ao equivale a fumar en promedio un paquete de cigarrillos diario durante un ao. Los exmenes de deteccin anuales:  Deben hacerse hasta que hayan pasado 15aos desde que dej de fumar.  Deben dejar de realizarse si tiene un problema de salud que le impide recibir tratamiento para el cncer de pulmn. Cncer colorrectal  Este tipo de cncer puede detectarse y a menudo prevenirse.  El estudio de [place] de Programme researcher, broadcasting/film/video del cncer colorrectal debe comenzar a Electrical engineer a Proofreader de los 50aos y [place].  El mdico puede aconsejarle que lo haga antes, si tiene factores de riesgo de Best boy cncer de colon.  Si tiene antecedentes familiares de cncer colorrectal, hable con el mdico para someterse a un estudio gentico.  El mdico tambin puede recomendarle que use un kit de prueba para Engineer, mining a fin de Educational psychologist en la materia fecal.  Es posible que se use una pequea cmara en el extremo de un tubo para examinar directamente el colon  (sigmoidoscopia o colonoscopia) a fin de Hydrographic surveyor formas tempranas de cncer colorrectal.  El examen directo del colon se debe repetir cada 5 a 10aos hasta los 108aos. Sin embargo, si se hallan formas incipientes de plipos precancerosos o pequeos tumores, o si tiene antecedentes familiares o riesgo gentico de Therapist, music, debe realizarse exmenes de deteccin con ms frecuencia. Cncer de piel  Revise la piel de la cabeza a los pies con regularidad.  Contrlese los lunares. Infrmele al mdico: ? Si aparecen nuevos lunares o los que tiene se modifican, especialmente en su forma o color. ? Si tiene un lunar que es ms grande que el tamao de una goma de Games developer.  Si alguno de los miembros de su familia tiene antecedentes de cncer de piel, especialmente a una edad temprana, hable con el mdico para someterse a pruebas genticas.  Siempre use pantalla solar. Aplique pantalla solar de Kerry Dory y repetida a lo largo del Training and development officer.  Protjase usando mangas y [employer], un sombrero de ala ancha y gafas para el sol, siempre que est al [place]. QU DEBO SABER SOBRE LA OSTEOPOROSIS? La osteoporosis es [place] afeccin  en la cual la destruccin de la masa sea ocurre con mayor rapidez que su formacin. Despus de la menopausia, puede correr un riesgo ms alto de tener osteoporosis. Para ayudar a prevenir esta afeccin o las fracturas seas que pueden ocurrir a causa de [place], se recomienda lo siguiente:  Si tiene entre 19 y 50aos, tome como mnimo 1071m de calcio y 6033mde vitaminaD por daTraining and development officer Si es mayor de 50aos pero menor de 70aos, tome como mnimo 120048me calcio y 600m31m vitaminaD por da. Training and development officeri es mayor de 70aos, tome como mnimo 1200mg6mcalcio y 800mg 52mitaminaD por da. ElTraining and development officerabaquismo y el consumo excesivo de alcohol aumentan el riesgo de osteoporosis. Consuma alimentos ricos en calcio y vitaminaD, y haga ejercicios con soporte de peso varias veces  a la semana, como se lo haya indicado el mdico. QU DEBO SABER SOBRE EL MODO EN QUE LA MENOPAUSIA AFECTA MI SALUD MENTAL? La depresin puede presentarse a cualquier edad, pero es ms frecuente a medida que una persona envejece. Los sntomas comunes de depresin incluyen lo siguiente:  Desnimo o tristeza.  Cambios en los patrones de sueo.  Cambios en el apetito o en los hbitos de alimentacin.  Sensacin de falta general de motivacin o placer al realizYahooidades que sola disfrutar.  Crisis frecuentes de llanto. Hable con el mdico si cree que tiene depresin. QU DEBO SABER SOBRE LAS VACUNAS? Es importante que se aplique las vacunas y las maDixs incluyen las siguientes:  Vacuna contra el ttanos, la difteria y la tosferina (Tdap).  Vacuna anual contra la gripe antes del inicio de la temporada de gripe.  Vacuna contra la neumona.  Vacuna contra el herpes. El mdico tambin puede recomendarle que se aplique otras vacunas. Esta informacin no tiene como fMarine scientistnsejo del mdico. Asegrese de hacerle al mdico cualquier pregunta que tenga. Document Released: 06/29/2013 Document Revised: 09/29/2014 Document Reviewed: 06/12/2015 Elsevier Interactive Patient Education  2018 [employer]

## 2017-11-23 LAB — PAP, TP IMAGING W/ HPV RNA, RFLX HPV TYPE 16,18/45: HPV DNA HIGH RISK: NOT DETECTED

## 2018-11-30 ENCOUNTER — Encounter: Payer: Managed Care, Other (non HMO) | Admitting: Obstetrics & Gynecology

## 2019-02-02 ENCOUNTER — Other Ambulatory Visit: Payer: Self-pay

## 2019-02-03 ENCOUNTER — Encounter: Payer: Self-pay | Admitting: Obstetrics & Gynecology

## 2019-02-03 ENCOUNTER — Ambulatory Visit (INDEPENDENT_AMBULATORY_CARE_PROVIDER_SITE_OTHER): Payer: Managed Care, Other (non HMO) | Admitting: Obstetrics & Gynecology

## 2019-02-03 VITALS — BP 130/86 | Ht 62.0 in | Wt 233.0 lb

## 2019-02-03 DIAGNOSIS — Z01419 Encounter for gynecological examination (general) (routine) without abnormal findings: Secondary | ICD-10-CM | POA: Diagnosis not present

## 2019-02-03 DIAGNOSIS — E66813 Obesity, class 3: Secondary | ICD-10-CM

## 2019-02-03 DIAGNOSIS — B351 Tinea unguium: Secondary | ICD-10-CM

## 2019-02-03 DIAGNOSIS — Z6841 Body Mass Index (BMI) 40.0 and over, adult: Secondary | ICD-10-CM

## 2019-02-03 DIAGNOSIS — Z9889 Other specified postprocedural states: Secondary | ICD-10-CM

## 2019-02-03 DIAGNOSIS — Z780 Asymptomatic menopausal state: Secondary | ICD-10-CM

## 2019-02-03 MED ORDER — IBUPROFEN 800 MG PO TABS: 800.0000 mg | ORAL_TABLET | Freq: Three times a day (TID) | ORAL | 3 refills | Status: DC | PRN

## 2019-02-03 NOTE — Progress Notes (Signed)
Jamie Montes Monday [birth year] [id number]   History:    63 y.o. [place] Married.  Has 3 grand-children and 1 great grand-child.  RP:  Established patient presenting for annual gyn exam   HPI: Menopause, well on no HRT.  No PMB.  No pelvic pain.  Abstinent x 2 year.  H/O LEEP CIN 2 in 2001 and H/O Laser of vulva for condylomas.  Urine/BMs normal.  Breasts normal.  BMI 42.62.  Not very active physically.  Fasting health labs here today.    Past medical history,surgical history, family history and social history were all reviewed and documented in the EPIC chart.  Gynecologic History Patient's last menstrual period was 09/13/2011. Contraception: abstinence and post menopausal status Last Pap: 10/2017. Results were: Negative/HPV HR neg Last mammogram: 2018. Results were: normal per patient.  Will schedule screening mammo at [facility] now. Bone Density: 09/2015 Normal.  Repeat at 5 yrs Colonoscopy: 2012, 10 yr schedule  Obstetric History OB History  Gravida Para Term Preterm AB Living  _0 SAB TAB Ectopic Multiple Live Births               # Outcome Date GA Lbr Len/2nd Weight Sex Delivery Anes PTL Lv  2 Para      Vag-Spont     1 Para      Vag-Spont        ROS: A ROS was performed and pertinent positives and negatives are included in the history.  GENERAL: No fevers or chills. HEENT: No change in vision, no earache, sore throat or sinus congestion. NECK: No pain or stiffness. CARDIOVASCULAR: No chest pain or pressure. No palpitations. PULMONARY: No shortness of breath, cough or wheeze. GASTROINTESTINAL: No abdominal pain, nausea, vomiting or diarrhea, melena or bright red blood per rectum. GENITOURINARY: No urinary frequency, urgency, hesitancy or dysuria. MUSCULOSKELETAL: No joint or muscle pain, no back pain, no recent trauma. DERMATOLOGIC: No rash, no itching, no lesions. ENDOCRINE: No polyuria, polydipsia, no heat or cold intolerance. No recent change in weight. HEMATOLOGICAL:  No anemia or easy bruising or bleeding. NEUROLOGIC: No headache, seizures, numbness, tingling or weakness. PSYCHIATRIC: No depression, no loss of interest in normal activity or change in sleep pattern.     Exam:   Ht _1  (1.575 m)   Wt 233 lb (105.7 kg)   LMP 09/13/2011   BMI 42.62 kg/m  BP 130/86 Body mass index is 42.62 kg/m.  General appearance : Well developed well nourished female. No acute distress HEENT: Eyes: no retinal hemorrhage or exudates,  Neck supple, trachea midline, no carotid bruits, no thyroidmegaly Lungs: Clear to auscultation, no rhonchi or wheezes, or rib retractions  Heart: Regular rate and rhythm, no murmurs or gallops Breast:Examined in sitting and supine position were symmetrical in appearance, no palpable masses or tenderness,  no skin retraction, no nipple inversion, no nipple discharge, no skin discoloration, no axillary or supraclavicular lymphadenopathy Abdomen: no palpable masses or tenderness, no rebound or guarding Extremities: no edema or skin discoloration or tenderness  Pelvic: Vulva: Normal             Vagina: No gross lesions or discharge  Cervix: No gross lesions or discharge.  Pap reflex done.  Uterus  AV, normal size, shape and consistency, non-tender and mobile  Adnexa  Without masses or tenderness  Anus: Normal   Assessment/Plan:  63 y.o. female for annual exam   1. Encounter for routine gynecological examination with  Papanicolaou smear of cervix Normal gynecologic exam in menopause.  Pap reflex done.  Breast exam normal.  Will schedule screening mammogram at [facility] now.  Fasting health labs here today.  Colonoscopy due in 2022. - CBC - Comp Met (CMET) - TSH - Lipid panel - VITAMIN D 25 Hydroxy (Vit-D Deficiency, Fractures)  2. H/O LEEP  3. Postmenopause Well on no HRT.  No PMB.  Will assess vitamin D level today and recommend supplements per results, calcium intake of 1200 to 1500 mg daily and regular weightbearing physical  activity is recommended.  Last bone density in January 2017 was normal, will repeat at 5 years.  4. Fungal infection of nail Refer to Dermatology.  5. Class 3 severe obesity due to excess calories without serious comorbidity with body mass index (BMI) of 40.0 to 44.9 in adult [facility]) Recommend lower calorie/carb diet such as [employer] and increase physical activity with aerobic activities 5 times a week and weightlifting every 2 days.  Other orders - omeprazole (PRILOSEC) 40 MG capsule; Take 40 mg by mouth daily. - metoprolol succinate (TOPROL-XL) 100 MG 24 hr tablet; Take 100 mg by mouth daily. Take with or immediately following a meal. - lisinopril-hydrochlorothiazide (ZESTORETIC) 20-12.5 MG tablet; Take 1 tablet by mouth daily. - ibuprofen (ADVIL) 800 MG tablet; Take 1 tablet (800 mg total) by mouth every 8 (eight) hours as needed.  Princess Bruins MD, 11:47 AM 02/03/2019

## 2019-02-03 NOTE — Patient Instructions (Signed)
1. Encounter for routine gynecological examination with Papanicolaou smear of cervix Normal gynecologic exam in menopause.  Pap reflex done.  Breast exam normal.  Will schedule screening mammogram at [facility] now.  Fasting health labs here today.  Colonoscopy due in 2022. - CBC - Comp Met (CMET) - TSH - Lipid panel - VITAMIN D 25 Hydroxy (Vit-D Deficiency, Fractures)  2. H/O LEEP  3. Postmenopause Well on no HRT.  No PMB.  Will assess vitamin D level today and recommend supplements per results, calcium intake of 1200 to 1500 mg daily and regular weightbearing physical activity is recommended.  Last bone density in January 2017 was normal, will repeat at 5 years.  4. Fungal infection of nail Refer to Dermatology.  5. Class 3 severe obesity due to excess calories without serious comorbidity with body mass index (BMI) of 40.0 to 44.9 in adult [facility]) Recommend lower calorie/carb diet such as [employer] and increase physical activity with aerobic activities 5 times a week and weightlifting every 2 days.  Other orders - omeprazole (PRILOSEC) 40 MG capsule; Take 40 mg by mouth daily. - metoprolol succinate (TOPROL-XL) 100 MG 24 hr tablet; Take 100 mg by mouth daily. Take with or immediately following a meal. - lisinopril-hydrochlorothiazide (ZESTORETIC) 20-12.5 MG tablet; Take 1 tablet by mouth daily. - ibuprofen (ADVIL) 800 MG tablet; Take 1 tablet (800 mg total) by mouth every 8 (eight) hours as needed.  [place]enton Brick, fue un placer verle hoy!  Voy a informarle de sus [employer].

## 2019-02-04 LAB — LIPID PANEL
Cholesterol: 207 mg/dL — ABNORMAL HIGH (ref <200)
HDL: 42 mg/dL — ABNORMAL LOW (ref >=50)
LDL Cholesterol (Calc): 125 mg/dL (calc) — ABNORMAL HIGH
Non-HDL Cholesterol (Calc): 165 mg/dL (calc) — ABNORMAL HIGH (ref <130)
Total CHOL/HDL Ratio: 4.9 (calc) (ref <5.0)
Triglycerides: 263 mg/dL — ABNORMAL HIGH (ref <150)

## 2019-02-04 LAB — CBC
HCT: 35.4 % (ref 35.0–45.0)
Hemoglobin: 12.1 g/dL (ref 11.7–15.5)
MCH: 30.3 pg (ref 27.0–33.0)
MCHC: 34.2 g/dL (ref 32.0–36.0)
MCV: 88.5 fL (ref 80.0–100.0)
MPV: 11.4 fL (ref 7.5–12.5)
Platelets: 213 10*3/uL (ref 140–400)
RBC: 4 Million/uL (ref 3.80–5.10)
RDW: 13.2 % (ref 11.0–15.0)
WBC: 7.6 10*3/uL (ref 3.8–10.8)

## 2019-02-04 LAB — COMPREHENSIVE METABOLIC PANEL
AG Ratio: 1.4 (calc) (ref 1.0–2.5)
ALT: 21 U/L (ref 6–29)
AST: 19 U/L (ref 10–35)
Albumin: 4.2 g/dL (ref 3.6–5.1)
Alkaline phosphatase (APISO): 87 U/L (ref 37–153)
BUN: 13 mg/dL (ref 7–25)
CO2: 28 mmol/L (ref 20–32)
Calcium: 9 mg/dL (ref 8.6–10.4)
Chloride: 106 mmol/L (ref 98–110)
Creat: 0.83 mg/dL (ref 0.50–0.99)
Globulin: 3 g/dL (calc) (ref 1.9–3.7)
Glucose, Bld: 91 mg/dL (ref 65–99)
Potassium: 3.5 mmol/L (ref 3.5–5.3)
Sodium: 142 mmol/L (ref 135–146)
Total Bilirubin: 0.4 mg/dL (ref 0.2–1.2)
Total Protein: 7.2 g/dL (ref 6.1–8.1)

## 2019-02-04 LAB — PAP IG W/ RFLX HPV ASCU

## 2019-02-04 LAB — VITAMIN D 25 HYDROXY (VIT D DEFICIENCY, FRACTURES): Vit D, 25-Hydroxy: 23 ng/mL — ABNORMAL LOW (ref 30–100)

## 2019-02-04 LAB — TSH: TSH: 2.26 mIU/L (ref 0.40–4.50)

## 2019-02-09 ENCOUNTER — Telehealth: Payer: Self-pay | Admitting: *Deleted

## 2019-02-09 NOTE — Telephone Encounter (Signed)
-----   Message from Princess Bruins, MD sent at 02/03/2019 12:11 PM EDT ----- Regarding: Refer to dermato Yeast of toe nails.

## 2019-02-09 NOTE — Telephone Encounter (Signed)
Patient scheduled on 03/04/19 @1 :15pm  at [facility] dermatology with Casimiro Needle, notes faxed to [phone]. Will route to [place] to relay information to patient in [place].

## 2019-02-09 NOTE — Telephone Encounter (Signed)
Patient informed. 

## 2019-03-02 ENCOUNTER — Other Ambulatory Visit: Payer: Self-pay | Admitting: Obstetrics & Gynecology

## 2019-03-02 DIAGNOSIS — E559 Vitamin D deficiency, unspecified: Secondary | ICD-10-CM

## 2019-07-25 ENCOUNTER — Other Ambulatory Visit: Payer: Self-pay

## 2019-07-25 DIAGNOSIS — Z20822 Contact with and (suspected) exposure to covid-19: Secondary | ICD-10-CM

## 2019-07-26 LAB — NOVEL CORONAVIRUS, NAA: SARS-CoV-2, NAA: NOT DETECTED

## 2019-07-27 ENCOUNTER — Telehealth: Payer: Self-pay | Admitting: General Practice

## 2019-07-27 NOTE — Telephone Encounter (Signed)
Negative COVID results given. Patient results "NOT Detected." Caller expressed understanding. ° °

## 2020-02-06 ENCOUNTER — Encounter: Payer: Managed Care, Other (non HMO) | Admitting: Obstetrics & Gynecology

## 2021-06-10 ENCOUNTER — Ambulatory Visit
Admission: EM | Admit: 2021-06-10 | Discharge: 2021-06-10 | Disposition: A | Discharge status: Home / Self Care | Payer: Managed Care, Other (non HMO)

## 2021-06-10 ENCOUNTER — Other Ambulatory Visit: Payer: Self-pay

## 2021-06-10 DIAGNOSIS — R040 Epistaxis: Secondary | ICD-10-CM

## 2021-06-10 DIAGNOSIS — Z8669 Personal history of other diseases of the nervous system and sense organs: Secondary | ICD-10-CM | POA: Diagnosis not present

## 2021-06-10 NOTE — Discharge Instructions (Addendum)
When you get home today, please reach out to either [facility] or most consistent to request a doctor that is Spanish-speaking.  The reason for the appointment should be Chiari malformation follow-up.  My recommendation that you do seeing neurology at least once a year for possible annual imaging and tracking of your symptoms.  Having this establish care with neurologist can also help you get seen faster should you have any new or worsening symptoms.  We discussed, for nosebleeds, please purchase an over-the-counter bottle of Afrin nasal spray, while this medication is recommended for congestion, please only use it when having nosebleeds as it can cause rebound congestion when used chronically for stuffiness.

## 2021-06-10 NOTE — ED Provider Notes (Signed)
[facility]    CSN: [id number] Arrival date & time: 06/10/21  0954      History   Chief Complaint Chief Complaint  Patient presents with   Numbness    HPI Bobie DelSocorro Cottman is a 65 y.o. female.   Patient is here with daughter today who provides interpretation for patient.  Pt reports last week having nasal bleeding with clots, and a pressure headache. She states last night the nasal bleeding returned slightly, last night she also had some weakness in lower extremities, facial numbness and chest tightness (that did not radiate). At this time patient denies having any numbness, denies vision changes, denies headaches, and denies chest pain/ tightness. Pt ambulatory at this time, able to move limbs against resistance, not experiencing any nosebleeds and states that the facial numbness, chest tightness and weakness has resolved.  Patient does endorse a 3 out of 10 posterior headache at this time, states this is not new and is tolerating well.  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  Patient adds that she is in between primary care providers at this time, states that her previous provider, who was Spanish-speaking, retired, and she has been looking for a new one.  Patient was seen by a primary care at [facility] on September 1, does not feel that her issues were appropriately addressed, daughter shares the same concern.  The history is provided by the patient. A language interpreter was used (Daughter).   Past Medical History:  Diagnosis Date   Anemia    Arthritis    Chiari malformation 2013   Dr. Sherwood Gambler   CIN II (cervical intraepithelial neoplasia II)    Condyloma    VULVAR / PERINEAL   History of colonic polyps    Hypertension    NSVD (normal spontaneous vaginal delivery)    X2   Vitamin D deficiency     Patient Active Problem List   Diagnosis Date Noted   Insomnia  05/16/2014   CIN II (cervical intraepithelial neoplasia II) 07/09/2012   HTN (hypertension) 07/09/2012   Symptoms, such as flushing, sleeplessness, headache, lack of concentration, associated with the menopause 07/09/2012   Colon polyps 07/09/2012   Family history of diabetes mellitus 07/09/2012   Vitamin D deficiency 07/01/2011   Weight gain 07/01/2011   Condyloma acuminatum in female 07/01/2011    Past Surgical History:  Procedure Laterality Date   CERVICAL BIOPSY  W/ LOOP ELECTRODE EXCISION  2001   CIN 2 , MARGINS FREE   CO2 LASER APPLICATION N/A 3/54/6568   Procedure: CO2 LASER VAPORIZATION OF VULVAR CONDYLOMA;  Surgeon: Terrance Mass, MD;  Location: [facility];  Service: Gynecology;  Laterality: N/A;   DILATION AND CURETTAGE OF UTERUS     IST TRIMESTER SAB   LESION REMOVAL N/A 05/05/2014   Procedure: EXCISION OF VULVAR/PERINEAL LESIONS;  Surgeon: Terrance Mass, MD;  Location: [facility];  Service: Gynecology;  Laterality: N/A;   LIPOMA EXCISION     LEFT ARM/shoulder   RESECTOSCOPIC POLYPECTOMY     SUBOCCIPITAL CRANIECTOMY CERVICAL LAMINECTOMY  04/12/2012   Procedure: SUBOCCIPITAL CRANIECTOMY CERVICAL LAMINECTOMY/DURAPLASTY;  Surgeon: Hosie Spangle, MD;  Location: [facility];  Service: Neurosurgery;  Laterality: N/A;  suboccipital craniectomy with upper cervical laminectomy with duroplasty    OB History     Gravida  2   Para  2   Term      Preterm  AB      Living  2      SAB      IAB      Ectopic      Multiple      Live Births               Home Medications    Prior to Admission medications   Medication Sig Start Date End Date Taking? Authorizing Provider  Cholecalciferol (VITAMIN D) 50 MCG (2000 UT) tablet TOME UNA TABLETA TODOS LOS D AS 05/27/21   [provider]  ibuprofen (ADVIL) 800 MG tablet Take 1 tablet (800 mg total) by mouth every 8 (eight) hours as needed. 02/03/19   Princess Bruins, MD  lisinopril-hydrochlorothiazide  (ZESTORETIC) 20-12.5 MG tablet Take 1 tablet by mouth daily.    [provider]  metoprolol succinate (TOPROL-XL) 100 MG 24 hr tablet Take 100 mg by mouth daily. Take with or immediately following a meal.    [provider]  omeprazole (PRILOSEC) 40 MG capsule Take 40 mg by mouth daily.    [provider]  zolpidem (AMBIEN) 10 MG tablet Take 1 tablet (10 mg total) by mouth at bedtime as needed for sleep. Patient not taking: Reported on 10/05/2015 05/16/14 06/15/14  Terrance Mass, MD    Family History Family History  Problem Relation Age of Onset   Hypertension Mother    Diabetes Mother    Stroke Mother    Colon cancer Maternal Aunt 74       COLON   Diabetes Father    CAD Neg Hx    Breast cancer Neg Hx     Social History Social History   Tobacco Use   Smoking status: Former    Types: Cigarettes    Quit date: 05/01/1998    Years since quitting: 23.1   Smokeless tobacco: Never  Vaping Use   Vaping Use: Never used  Substance Use Topics   Alcohol use: No    Alcohol/week: 0.0 standard drinks   Drug use: No     Allergies   Simvastatin   Review of Systems Review of Systems Per HPI  Physical Exam Triage Vital Signs ED Triage Vitals [06/10/21 1020]  Enc Vitals Group     BP      Pulse      Resp      Temp      Temp src      SpO2      Weight      Height      Head Circumference      Peak Flow      Pain Score 0     Pain Loc      Pain Edu?      Excl. in [place]?    No data found.  Updated Vital Signs BP 126/81 (BP Location: Right Arm)   Pulse (!) 57   Temp 97.9 F (36.6 C) (Oral)   Resp 20   LMP 09/13/2011   SpO2 97%   Visual Acuity Right Eye Distance:   Left Eye Distance:   Bilateral Distance:    Right Eye Near:   Left Eye Near:    Bilateral Near:     Physical Exam Vitals and nursing note reviewed.  Constitutional:      Appearance: Normal appearance.  HENT:     Head: Normocephalic and atraumatic.     Right Ear: Tympanic  membrane, ear canal and external ear normal.     Left Ear: Tympanic membrane,  ear canal and external ear normal.     Nose: Nose normal.     Mouth/Throat:     Mouth: Mucous membranes are moist.     Pharynx: Oropharynx is clear.  Eyes:     Extraocular Movements: Extraocular movements intact.     Conjunctiva/sclera: Conjunctivae normal.     Pupils: Pupils are equal, round, and reactive to light.  Cardiovascular:     Rate and Rhythm: Normal rate and regular rhythm.     Heart sounds: Normal heart sounds. No murmur heard.   No gallop.  Pulmonary:     Effort: Pulmonary effort is normal.     Breath sounds: Normal breath sounds. No wheezing.  Chest:     Chest wall: No tenderness.  Musculoskeletal:        General: Normal range of motion.     Cervical back: Normal range of motion and neck supple.  Skin:    General: Skin is warm and dry.  Neurological:     General: No focal deficit present.     Mental Status: She is alert. Mental status is at baseline. She is disoriented.     Cranial Nerves: No cranial nerve deficit.     Sensory: No sensory deficit.     Motor: No weakness.     Coordination: Coordination normal.     Gait: Gait normal.     Deep Tendon Reflexes: Reflexes normal.  Psychiatric:        Mood and Affect: Mood normal.        Behavior: Behavior normal.     UC Treatments / Results  Labs (all labs ordered are listed, but only abnormal results are displayed) Labs Reviewed - No data to display  EKG   Radiology No results found.  Procedures Procedures (including critical care time)  Medications Ordered in UC Medications - No data to display  Initial Impression / Assessment and Plan / UC Course  I have reviewed the triage vital signs and the nursing notes.  Pertinent labs & imaging results that were available during my care of the patient were reviewed by me and considered in my medical decision making (see chart for details).     I had a long discussion with  patient and daughter regarding routine follow-up of her history of Chiari malformation now status post surgery in 2013.  Patient states she does not believe that she has had any head imaging done since 2017, per EMR this was a CT scan and not an MRI.  Patient has that the nosebleeds are not new, states that she is aware that she should pinch and lean forward when they occur, patient demonstrates this in the office for me.  I have advised patient to reach out to either [facility] or [facility] number to request a Spanish-speaking primary care provider.  It is my opinion that patients who are able to communicate in their own language often get better care then we may have to utilize a Printmaker and I shared this with her daughter who agreed.  I also recommended patient carry Afrin nasal spray with her so that she should get a nosebleed when not at home, she can use the spray for temporary relief.  Patient and daughter verbalized understanding of my recommendations, all questions were addressed during visit. Final Clinical Impressions(s) / UC Diagnoses   Final diagnoses:  History of Chiari malformation  Epistaxis     Discharge Instructions      When you get home  today, please reach out to either [facility] or [facility] to request a doctor that is Spanish-speaking.  The reason for the appointment should be Chiari malformation follow-up.  My recommendation that you do seeing neurology at least once a year for possible annual imaging and tracking of your symptoms.  Having this establish care with neurologist can also help you get seen faster should you have any new or worsening symptoms.  We discussed, for nosebleeds, please purchase an over-the-counter bottle of Afrin nasal spray, while this medication is recommended for congestion, please only use it when having nosebleeds as it can cause rebound congestion when used chronically for stuffiness.     ED Prescriptions    None    PDMP not reviewed this encounter.   Lynden Oxford Scales, PA-C 06/10/21 1117

## 2021-06-10 NOTE — ED Triage Notes (Signed)
Pt reports last week having nasal bleeding with clots, and a pressure headache. She states last night the nasal bleeding returned slightly, last night she also had some weakness in lower extremities, facial numbness and chest tightness (that did not radiate). At this time patient denies having any numbness, denies vision changes, denies headaches, and denies chest pain/ tightness. Pt ambulatory able to move limbs against resistance.

## 2021-10-07 ENCOUNTER — Encounter: Payer: Self-pay | Admitting: Obstetrics & Gynecology

## 2021-10-07 ENCOUNTER — Telehealth: Payer: Self-pay | Admitting: *Deleted

## 2021-10-07 ENCOUNTER — Ambulatory Visit: Payer: Managed Care, Other (non HMO) | Admitting: Obstetrics & Gynecology

## 2021-10-07 ENCOUNTER — Other Ambulatory Visit: Payer: Self-pay

## 2021-10-07 VITALS — BP 140/82 | HR 58 | Resp 20

## 2021-10-07 DIAGNOSIS — G8929 Other chronic pain: Secondary | ICD-10-CM | POA: Diagnosis not present

## 2021-10-07 DIAGNOSIS — N644 Mastodynia: Secondary | ICD-10-CM

## 2021-10-07 DIAGNOSIS — M25562 Pain in left knee: Secondary | ICD-10-CM | POA: Diagnosis not present

## 2021-10-07 MED ORDER — IBUPROFEN 800 MG PO TABS: 800.0000 mg | ORAL_TABLET | Freq: Three times a day (TID) | ORAL | 3 refills | Status: DC | PRN

## 2021-10-07 NOTE — Telephone Encounter (Signed)
-----   Message from Princess Bruins, MD sent at 10/07/2021 10:25 AM EST ----- Regarding: Left Dx mammo/US and Right screening mammo Left breast outer lower quadrant pain on off x 2 weeks.  No lump felt.  No skin change.  No discharge from the nipple.  No Fam H/O Breast Ca.  Last mammo 08/2020 was Negative.  Negative breast and Axillary exam.  Will schedule a Left Dx mammo/US and Right screening mammo. Schedule asap as patient will leave x 1 month to [place] at the end of January 2023.

## 2021-10-07 NOTE — Telephone Encounter (Signed)
Patient scheduled on 10/14/21 @ 9:00 am at [facility], order faxed. Please relay no lotion, powder or deodorant to armpit or breasts area day of imaging.  Will route to [place] to relay in [place].

## 2021-10-07 NOTE — Telephone Encounter (Signed)
Patient was informed of appointment date and time. All information was provided in [place] per [place].

## 2021-10-07 NOTE — Progress Notes (Signed)
° ° °  Jamie Montes Monday [birth year] [id number]        65 y.o.  G2P2L2  RP: Left breast pain x 2 weeks  HPI: Left breast outer lower quadrant pain on off x 2 weeks.  No lump felt.  No skin change.  No discharge from the nipple.  No Fam H/O Breast Ca.  Last mammo 08/2020 was Negative.   OB History  Gravida Para Term Preterm AB Living  2 2       2   SAB IAB Ectopic Multiple Live Births               # Outcome Date GA Lbr Len/2nd Weight Sex Delivery Anes PTL Lv  2 Para      Vag-Spont     1 Para      Vag-Spont       Past medical history,surgical history, problem list, medications, allergies, family history and social history were all reviewed and documented in the EPIC chart.   Directed ROS with pertinent positives and negatives documented in the history of present illness/assessment and plan.  Exam:  Vitals:   10/07/21 0954  BP: 140/82  Pulse: (!) 58  Resp: 20  SpO2: 98%   General appearance:  Normal  Breast exam:  Rt breast and Axilla Neg                        Lt breast and Axilla Neg.  No nodule/mass felt.  Non-tender to palpation.  Skin and nipple normal.  No nipple d/c.   Assessment/Plan:  66 y.o. G2P2   1. Breast pain, left Left breast outer lower quadrant pain on off x 2 weeks.  No lump felt.  No skin change.  No discharge from the nipple.  No Fam H/O Breast Ca.  Last mammo 08/2020 was Negative.  Negative breast and Axillary exam.  Will schedule a Left Dx mammo/US and Right screening mammo.  2. Chronic pain of left knee Using Ibuprofen as needed.  No worsening, pain is experienced after long days of physical work.  Prescription sent to pharmacy. Other orders - ibuprofen (ADVIL) 800 MG tablet; Take 1 tablet (800 mg total) by mouth every 8 (eight) hours as needed.   Princess Bruins MD, 10:19 AM 10/07/2021

## 2021-10-15 ENCOUNTER — Encounter: Payer: Self-pay | Admitting: Obstetrics & Gynecology

## 2021-10-21 ENCOUNTER — Ambulatory Visit: Payer: Managed Care, Other (non HMO) | Admitting: Obstetrics & Gynecology

## 2021-11-19 ENCOUNTER — Ambulatory Visit: Payer: Managed Care, Other (non HMO) | Admitting: Obstetrics & Gynecology

## 2022-04-14 ENCOUNTER — Other Ambulatory Visit: Payer: Self-pay | Admitting: Obstetrics & Gynecology

## 2022-04-14 NOTE — Telephone Encounter (Signed)
Per 10/07/21 "Chronic pain of left knee Using Ibuprofen as needed.  No worsening, pain is experienced after long days of physical work."

## 2022-06-16 ENCOUNTER — Other Ambulatory Visit: Payer: Self-pay | Admitting: Obstetrics & Gynecology

## 2022-07-05 ENCOUNTER — Other Ambulatory Visit: Payer: Self-pay | Admitting: Obstetrics & Gynecology

## 2022-07-07 NOTE — Telephone Encounter (Signed)
Last annual exam was 01/2019 No exam scheduled

## 2022-09-11 ENCOUNTER — Other Ambulatory Visit: Payer: Self-pay | Admitting: Obstetrics & Gynecology

## 2022-09-11 NOTE — Telephone Encounter (Signed)
Last annual exam 01/2019  Rx denied needs to schedule annual exam.

## 2022-12-03 ENCOUNTER — Emergency Department (HOSPITAL_BASED_OUTPATIENT_CLINIC_OR_DEPARTMENT_OTHER): Payer: Managed Care, Other (non HMO)

## 2022-12-03 ENCOUNTER — Other Ambulatory Visit: Payer: Self-pay

## 2022-12-03 ENCOUNTER — Emergency Department (HOSPITAL_BASED_OUTPATIENT_CLINIC_OR_DEPARTMENT_OTHER)
Admission: EM | Admit: 2022-12-03 | Discharge: 2022-12-03 | Disposition: A | Discharge status: Home / Self Care | Payer: Managed Care, Other (non HMO) | Attending: Emergency Medicine | Admitting: Emergency Medicine

## 2022-12-03 DIAGNOSIS — K219 Gastro-esophageal reflux disease without esophagitis: Secondary | ICD-10-CM | POA: Diagnosis not present

## 2022-12-03 DIAGNOSIS — Z79899 Other long term (current) drug therapy: Secondary | ICD-10-CM | POA: Diagnosis not present

## 2022-12-03 DIAGNOSIS — I10 Essential (primary) hypertension: Secondary | ICD-10-CM | POA: Diagnosis not present

## 2022-12-03 DIAGNOSIS — R079 Chest pain, unspecified: Secondary | ICD-10-CM | POA: Diagnosis present

## 2022-12-03 LAB — CBC
HCT: 35.5 % — ABNORMAL LOW (ref 36.0–46.0)
Hemoglobin: 11.7 g/dL — ABNORMAL LOW (ref 12.0–15.0)
MCH: 29.3 pg (ref 26.0–34.0)
MCHC: 33 g/dL (ref 30.0–36.0)
MCV: 88.8 fL (ref 80.0–100.0)
Platelets: 227 10*3/uL (ref 150–400)
RBC: 4 MIL/uL (ref 3.87–5.11)
RDW: 13.5 % (ref 11.5–15.5)
WBC: 7.2 10*3/uL (ref 4.0–10.5)
nRBC: 0 % (ref 0.0–0.2)

## 2022-12-03 LAB — BASIC METABOLIC PANEL
Anion gap: 6 (ref 5–15)
BUN: 24 mg/dL — ABNORMAL HIGH (ref 8–23)
CO2: 25 mmol/L (ref 22–32)
Calcium: 9 mg/dL (ref 8.9–10.3)
Chloride: 105 mmol/L (ref 98–111)
Creatinine, Ser: 1.05 mg/dL — ABNORMAL HIGH (ref 0.44–1.00)
GFR, Estimated: 59 mL/min — ABNORMAL LOW (ref >60)
Glucose, Bld: 112 mg/dL — ABNORMAL HIGH (ref 70–99)
Potassium: 3.6 mmol/L (ref 3.5–5.1)
Sodium: 136 mmol/L (ref 135–145)

## 2022-12-03 LAB — TROPONIN I (HIGH SENSITIVITY)
Troponin I (High Sensitivity): 3 ng/L (ref <18)
Troponin I (High Sensitivity): 4 ng/L (ref <18)

## 2022-12-03 MED ORDER — FAMOTIDINE 20 MG PO TABS: 20.0000 mg | ORAL_TABLET | Freq: Two times a day (BID) | ORAL | 0 refills | Status: AC

## 2022-12-03 MED ORDER — ALUM & MAG HYDROXIDE-SIMETH 200-200-20 MG/5ML PO SUSP
30.0000 mL | Freq: Once | ORAL | Status: AC
  Administered 2022-12-03: 30 mL via ORAL
  Filled 2022-12-03: qty 30

## 2022-12-03 NOTE — ED Provider Notes (Signed)
[facility] Provider Note   CSN: [id number] Arrival date & time: 12/03/22  0148     History  Chief Complaint  Patient presents with   Chest Pain    Thereasa DelSocorro Montes is a 67 y.o. female.  The history is provided by the patient and a relative.  Chest Pain Pain location:  Epigastric Pain quality: burning and pressure   Pain radiates to:  Does not radiate Associated symptoms: no fever, no palpitations and no shortness of breath   Patient with HTN who has had multiple episodes of epigastric pain in the evenings.  Tonight ate tostados was sauces and refried beans and then laid down and had pressure and burning.  No exertional symptoms.  No SOB.  No n/v/d.       Home Medications Prior to Admission medications   Medication Sig Start Date End Date Taking? Authorizing Provider  famotidine (PEPCID) 20 MG tablet Take 1 tablet (20 mg total) by mouth 2 (two) times daily. 12/03/22  Yes Danell Vazquez, MD  Cholecalciferol (VITAMIN D) 50 MCG (2000 UT) tablet TOME UNA TABLETA TODOS LOS D AS 05/27/21   [provider]  ibuprofen (ADVIL) 800 MG tablet TOME UNA TABLETA CADA OCHO HORAS CUANDO SEA NECESARIO 07/07/22   Princess Bruins, MD  lisinopril-hydrochlorothiazide (ZESTORETIC) 20-12.5 MG tablet Take 1 tablet by mouth daily.    [provider]  metoprolol succinate (TOPROL-XL) 100 MG 24 hr tablet Take 100 mg by mouth daily. Take with or immediately following a meal.    [provider]      Allergies    Simvastatin    Review of Systems   Review of Systems  Constitutional:  Negative for fever.  HENT:  Negative for ear discharge.   Eyes:  Negative for redness.  Respiratory:  Negative for shortness of breath.   Cardiovascular:  Positive for chest pain. Negative for palpitations and leg swelling.  All other systems reviewed and are negative.   Physical Exam Updated Vital Signs BP (!) 188/78 (BP Location: Left  Arm)   Pulse (!) 56   Temp 98.1 F (36.7 C)   Resp 16   Ht '5\' 6"'$  (1.676 m)   Wt 108.9 kg   LMP 09/13/2011   SpO2 100%   BMI 38.74 kg/m  Physical Exam Vitals and nursing note reviewed.  Constitutional:      General: She is not in acute distress.    Appearance: Normal appearance. She is well-developed.  HENT:     Head: Normocephalic and atraumatic.     Nose: Nose normal.  Eyes:     Pupils: Pupils are equal, round, and reactive to light.  Cardiovascular:     Rate and Rhythm: Normal rate and regular rhythm.     Pulses: Normal pulses.     Heart sounds: Normal heart sounds.  Pulmonary:     Effort: Pulmonary effort is normal. No respiratory distress.     Breath sounds: Normal breath sounds.  Abdominal:     General: There is no distension.     Palpations: Abdomen is soft.     Tenderness: There is no abdominal tenderness. There is no guarding or rebound.     Comments: Very gassy   Genitourinary:    Vagina: No vaginal discharge.  Musculoskeletal:        General: Normal range of motion.     Cervical back: Normal range of motion and neck supple.  Skin:    General: Skin  is warm and dry.     Capillary Refill: Capillary refill takes less than 2 seconds.     Findings: No erythema or rash.  Neurological:     General: No focal deficit present.     Mental Status: She is alert and oriented to person, place, and time.     Deep Tendon Reflexes: Reflexes normal.  Psychiatric:        Mood and Affect: Mood normal.     ED Results / Procedures / Treatments   Labs (all labs ordered are listed, but only abnormal results are displayed) Results for orders placed or performed during the hospital encounter of [id number]  Basic metabolic panel  Result Value Ref Range   Sodium 136 135 - 145 mmol/L   Potassium 3.6 3.5 - 5.1 mmol/L   Chloride 105 98 - 111 mmol/L   CO2 25 22 - 32 mmol/L   Glucose, Bld 112 (H) 70 - 99 mg/dL   BUN 24 (H) 8 - 23 mg/dL   Creatinine, Ser 1.05 (H) 0.44 - 1.00 mg/dL    Calcium 9.0 8.9 - 10.3 mg/dL   GFR, Estimated 59 (L) >60 mL/min   Anion gap 6 5 - 15  CBC  Result Value Ref Range   WBC 7.2 4.0 - 10.5 K/uL   RBC 4.00 3.87 - 5.11 MIL/uL   Hemoglobin 11.7 (L) 12.0 - 15.0 g/dL   HCT 35.5 (L) 36.0 - 46.0 %   MCV 88.8 80.0 - 100.0 fL   MCH 29.3 26.0 - 34.0 pg   MCHC 33.0 30.0 - 36.0 g/dL   RDW 13.5 11.5 - 15.5 %   Platelets 227 150 - 400 K/uL   nRBC 0.0 0.0 - 0.2 %  Troponin I (High Sensitivity)  Result Value Ref Range   Troponin I (High Sensitivity) 3 <18 ng/L  Troponin I (High Sensitivity)  Result Value Ref Range   Troponin I (High Sensitivity) 4 <18 ng/L   DG Chest 2 View  Result Date: 12/03/2022 CLINICAL DATA:  Chest pain EXAM: CHEST - 2 VIEW COMPARISON:  06/24/2015 FINDINGS: Stable cardiomediastinal silhouette. Mild chronic bilateral interstitial coarsening. No focal consolidation, pleural effusion, or pneumothorax. No displaced rib fractures. IMPRESSION: No acute cardiopulmonary process. Electronically Signed   By: Placido Sou M.D.   On: 12/03/2022 02:24     EKG EKG Interpretation  Date/Time:  Wednesday December 03 2022 01:56:33 EDT Ventricular Rate:  65 PR Interval:  159 QRS Duration: 109 QT Interval:  425 QTC Calculation: 442 R Axis:   -22 Text Interpretation: Sinus rhythm Borderline left axis deviation Confirmed by Randal Buba, Nijel Flink ([id number]) on 12/03/2022 4:33:58 AM  Radiology DG Chest 2 View  Result Date: 12/03/2022 CLINICAL DATA:  Chest pain EXAM: CHEST - 2 VIEW COMPARISON:  06/24/2015 FINDINGS: Stable cardiomediastinal silhouette. Mild chronic bilateral interstitial coarsening. No focal consolidation, pleural effusion, or pneumothorax. No displaced rib fractures. IMPRESSION: No acute cardiopulmonary process. Electronically Signed   By: Placido Sou M.D.   On: 12/03/2022 02:24    Procedures Procedures    Medications Ordered in ED Medications  alum & mag hydroxide-simeth (MAALOX/MYLANTA) 200-200-20 MG/5ML suspension 30 mL  (has no administration in time range)    ED Course/ Medical Decision Making/ A&P                             Medical Decision Making Patient with pain multiple times at night, tonight after eating a tostado and refried beans  and dip.    Amount and/or Complexity of Data Reviewed Independent Historian:     Details: Daughter see above  External Data Reviewed: notes.    Details: Previous notes reviewed  Labs: ordered.    Details: All labs reviewed 2 negative troponins 3/4.  Normal sodium 136, normal potassium 3.6, creatinine 1.05.  normal white count 7.2, hemoglobin slight low 11.7, normal platelet count.   Radiology: ordered and independent interpretation performed.    Details: Negative CXR by me  ECG/medicine tests: ordered and independent interpretation performed. Decision-making details documented in ED Course.  Risk OTC drugs. Risk Details: Ruled out for MI in the ED HEART score is 2 low risk for MACE.  History and exam are consistent with GERD.  Have advised GERD friendly diet and will start pepcid.  Stable for discharge.  Strict return.  FOllow up with your PMD.     Final Clinical Impression(s) / ED Diagnoses Final diagnoses:  Gastroesophageal reflux disease, unspecified whether esophagitis present   Return for intractable cough, coughing up blood, fevers > 100.4 unrelieved by medication, shortness of breath, intractable vomiting, chest pain, shortness of breath, weakness, numbness, changes in speech, facial asymmetry, abdominal pain, passing out, Inability to tolerate liquids or food, cough, altered mental status or any concerns. No signs of systemic illness or infection. The patient is nontoxic-appearing on exam and vital signs are within normal limits.  I have reviewed the triage vital signs and the nursing notes. Pertinent labs & imaging results that were available during my care of the patient were reviewed by me and considered in my medical decision making (see chart for  details). After history, exam, and medical workup I feel the patient has been appropriately medically screened and is safe for discharge home. Pertinent diagnoses were discussed with the patient. Patient was given return precautions. Rx / DC Orders ED Discharge Orders          Ordered    famotidine (PEPCID) 20 MG tablet  2 times daily        12/03/22 0434              Jaquavian Firkus, MD 12/03/22 [phone]

## 2022-12-03 NOTE — ED Triage Notes (Signed)
Pt reports chest pain tonight while resting, reports the pain as a burning sensation and does not radiate. Pt reports pain is intermittent and initially had nausea with the pain.

## 2023-07-07 ENCOUNTER — Other Ambulatory Visit: Payer: Self-pay | Admitting: Family Medicine

## 2023-07-07 ENCOUNTER — Ambulatory Visit
Admission: RE | Admit: 2023-07-07 | Discharge: 2023-07-07 | Disposition: A | Discharge status: Home / Self Care | Payer: Managed Care, Other (non HMO) | Source: Ambulatory Visit | Attending: Family Medicine | Admitting: Family Medicine

## 2023-07-07 DIAGNOSIS — R1032 Left lower quadrant pain: Secondary | ICD-10-CM
